# Patient Record
Sex: Female | Born: 1940 | Hispanic: No | Smoking: Never smoker
Health system: Southern US, Community
[De-identification: ages and names within clinical notes are randomized; demographics above are authoritative.]

## PROBLEM LIST (undated history)

## (undated) DIAGNOSIS — E039 Hypothyroidism, unspecified: Secondary | ICD-10-CM

## (undated) DIAGNOSIS — E119 Type 2 diabetes mellitus without complications: Secondary | ICD-10-CM

## (undated) DIAGNOSIS — I2510 Atherosclerotic heart disease of native coronary artery without angina pectoris: Secondary | ICD-10-CM

## (undated) DIAGNOSIS — F32A Depression, unspecified: Secondary | ICD-10-CM

## (undated) DIAGNOSIS — I10 Essential (primary) hypertension: Secondary | ICD-10-CM

## (undated) DIAGNOSIS — E785 Hyperlipidemia, unspecified: Secondary | ICD-10-CM

## (undated) DIAGNOSIS — J45909 Unspecified asthma, uncomplicated: Secondary | ICD-10-CM

## (undated) DIAGNOSIS — F329 Major depressive disorder, single episode, unspecified: Secondary | ICD-10-CM

## (undated) HISTORY — PX: OTHER SURGICAL HISTORY: SHX169

## (undated) HISTORY — PX: MINOR HEMORRHOIDECTOMY: SHX6238

## (undated) HISTORY — DX: Essential (primary) hypertension: I10

## (undated) HISTORY — DX: Major depressive disorder, single episode, unspecified: F32.9

## (undated) HISTORY — DX: Depression, unspecified: F32.A

## (undated) HISTORY — DX: Type 2 diabetes mellitus without complications: E11.9

## (undated) HISTORY — PX: EYE SURGERY: SHX253

## (undated) HISTORY — DX: Hyperlipidemia, unspecified: E78.5

---

## 1998-03-07 HISTORY — PX: COLONOSCOPY: SHX174

## 2003-10-26 ENCOUNTER — Inpatient Hospital Stay (HOSPITAL_COMMUNITY): Admission: EM | Admit: 2003-10-26 | Discharge: 2003-10-30 | Payer: Self-pay | Admitting: Cardiology

## 2004-01-26 ENCOUNTER — Inpatient Hospital Stay (HOSPITAL_COMMUNITY): Admission: AD | Admit: 2004-01-26 | Discharge: 2004-01-28 | Payer: Self-pay | Admitting: Cardiology

## 2004-08-08 ENCOUNTER — Inpatient Hospital Stay (HOSPITAL_COMMUNITY): Admission: EM | Admit: 2004-08-08 | Discharge: 2004-08-12 | Payer: Self-pay | Admitting: Emergency Medicine

## 2004-10-05 ENCOUNTER — Ambulatory Visit (HOSPITAL_COMMUNITY): Admission: RE | Admit: 2004-10-05 | Discharge: 2004-10-05 | Payer: Self-pay | Admitting: *Deleted

## 2005-01-07 ENCOUNTER — Ambulatory Visit (HOSPITAL_COMMUNITY): Admission: RE | Admit: 2005-01-07 | Discharge: 2005-01-07 | Payer: Self-pay | Admitting: Cardiology

## 2005-01-20 ENCOUNTER — Inpatient Hospital Stay (HOSPITAL_COMMUNITY): Admission: RE | Admit: 2005-01-20 | Discharge: 2005-01-21 | Payer: Self-pay | Admitting: Cardiology

## 2005-12-21 ENCOUNTER — Ambulatory Visit: Payer: Self-pay | Admitting: Internal Medicine

## 2006-03-30 ENCOUNTER — Inpatient Hospital Stay (HOSPITAL_COMMUNITY): Admission: EM | Admit: 2006-03-30 | Discharge: 2006-04-01 | Payer: Self-pay | Admitting: Emergency Medicine

## 2006-05-05 ENCOUNTER — Ambulatory Visit: Payer: Self-pay | Admitting: Internal Medicine

## 2007-11-21 ENCOUNTER — Ambulatory Visit: Payer: Self-pay

## 2010-03-28 ENCOUNTER — Encounter: Payer: Self-pay | Admitting: Cardiology

## 2010-12-24 ENCOUNTER — Other Ambulatory Visit (HOSPITAL_COMMUNITY): Payer: Self-pay | Admitting: Cardiology

## 2010-12-29 ENCOUNTER — Other Ambulatory Visit (HOSPITAL_COMMUNITY): Payer: Self-pay

## 2011-02-22 ENCOUNTER — Other Ambulatory Visit (HOSPITAL_COMMUNITY): Payer: Self-pay | Admitting: Cardiology

## 2011-02-28 ENCOUNTER — Other Ambulatory Visit (HOSPITAL_COMMUNITY): Payer: Self-pay

## 2011-03-03 ENCOUNTER — Ambulatory Visit (HOSPITAL_COMMUNITY): Payer: Self-pay

## 2011-03-03 ENCOUNTER — Ambulatory Visit (HOSPITAL_COMMUNITY)
Admission: RE | Admit: 2011-03-03 | Discharge: 2011-03-03 | Disposition: A | Discharge status: Home / Self Care | Payer: Medicare Other | Source: Ambulatory Visit | Attending: Cardiology | Admitting: Cardiology

## 2011-03-03 DIAGNOSIS — R0602 Shortness of breath: Secondary | ICD-10-CM | POA: Insufficient documentation

## 2011-03-03 DIAGNOSIS — E119 Type 2 diabetes mellitus without complications: Secondary | ICD-10-CM | POA: Insufficient documentation

## 2011-03-03 DIAGNOSIS — R079 Chest pain, unspecified: Secondary | ICD-10-CM | POA: Insufficient documentation

## 2011-03-03 DIAGNOSIS — I10 Essential (primary) hypertension: Secondary | ICD-10-CM | POA: Insufficient documentation

## 2011-03-03 DIAGNOSIS — I2510 Atherosclerotic heart disease of native coronary artery without angina pectoris: Secondary | ICD-10-CM | POA: Insufficient documentation

## 2011-03-03 MED ORDER — TECHNETIUM TC 99M TETROFOSMIN IV KIT
30.0000 | PACK | Freq: Once | INTRAVENOUS | Status: AC | PRN
  Administered 2011-03-03: 30 via INTRAVENOUS

## 2011-03-03 MED ORDER — REGADENOSON 0.4 MG/5ML IV SOLN
INTRAVENOUS | Status: AC
  Administered 2011-03-03: 0.4 mg via INTRAVENOUS
  Filled 2011-03-03: qty 5

## 2011-03-03 MED ORDER — TECHNETIUM TC 99M TETROFOSMIN IV KIT
10.0000 | PACK | Freq: Once | INTRAVENOUS | Status: AC | PRN
  Administered 2011-03-03: 10 via INTRAVENOUS

## 2011-03-10 DIAGNOSIS — E1065 Type 1 diabetes mellitus with hyperglycemia: Secondary | ICD-10-CM | POA: Diagnosis not present

## 2011-03-10 DIAGNOSIS — R0602 Shortness of breath: Secondary | ICD-10-CM | POA: Diagnosis not present

## 2011-03-10 DIAGNOSIS — D518 Other vitamin B12 deficiency anemias: Secondary | ICD-10-CM | POA: Diagnosis not present

## 2011-03-10 DIAGNOSIS — M159 Polyosteoarthritis, unspecified: Secondary | ICD-10-CM | POA: Diagnosis not present

## 2011-03-10 DIAGNOSIS — E039 Hypothyroidism, unspecified: Secondary | ICD-10-CM | POA: Diagnosis not present

## 2011-03-10 DIAGNOSIS — N182 Chronic kidney disease, stage 2 (mild): Secondary | ICD-10-CM | POA: Diagnosis not present

## 2011-03-10 DIAGNOSIS — E785 Hyperlipidemia, unspecified: Secondary | ICD-10-CM | POA: Diagnosis not present

## 2011-03-10 DIAGNOSIS — F0280 Dementia in other diseases classified elsewhere without behavioral disturbance: Secondary | ICD-10-CM | POA: Diagnosis not present

## 2011-09-20 DIAGNOSIS — I6529 Occlusion and stenosis of unspecified carotid artery: Secondary | ICD-10-CM | POA: Diagnosis not present

## 2011-09-20 DIAGNOSIS — J45909 Unspecified asthma, uncomplicated: Secondary | ICD-10-CM | POA: Diagnosis not present

## 2011-09-20 DIAGNOSIS — J309 Allergic rhinitis, unspecified: Secondary | ICD-10-CM | POA: Diagnosis not present

## 2011-09-20 DIAGNOSIS — E1065 Type 1 diabetes mellitus with hyperglycemia: Secondary | ICD-10-CM | POA: Diagnosis not present

## 2011-09-20 DIAGNOSIS — M1990 Unspecified osteoarthritis, unspecified site: Secondary | ICD-10-CM | POA: Diagnosis not present

## 2011-09-20 DIAGNOSIS — E782 Mixed hyperlipidemia: Secondary | ICD-10-CM | POA: Diagnosis not present

## 2011-09-20 DIAGNOSIS — M159 Polyosteoarthritis, unspecified: Secondary | ICD-10-CM | POA: Diagnosis not present

## 2011-09-20 DIAGNOSIS — N182 Chronic kidney disease, stage 2 (mild): Secondary | ICD-10-CM | POA: Diagnosis not present

## 2012-08-16 DIAGNOSIS — E039 Hypothyroidism, unspecified: Secondary | ICD-10-CM | POA: Diagnosis not present

## 2012-08-16 DIAGNOSIS — I2510 Atherosclerotic heart disease of native coronary artery without angina pectoris: Secondary | ICD-10-CM | POA: Diagnosis not present

## 2012-08-16 DIAGNOSIS — I10 Essential (primary) hypertension: Secondary | ICD-10-CM | POA: Diagnosis not present

## 2012-08-16 DIAGNOSIS — E782 Mixed hyperlipidemia: Secondary | ICD-10-CM | POA: Diagnosis not present

## 2012-08-16 DIAGNOSIS — D509 Iron deficiency anemia, unspecified: Secondary | ICD-10-CM | POA: Diagnosis not present

## 2012-08-16 DIAGNOSIS — R0602 Shortness of breath: Secondary | ICD-10-CM | POA: Diagnosis not present

## 2012-08-16 DIAGNOSIS — D518 Other vitamin B12 deficiency anemias: Secondary | ICD-10-CM | POA: Diagnosis not present

## 2012-08-16 DIAGNOSIS — G4701 Insomnia due to medical condition: Secondary | ICD-10-CM | POA: Diagnosis not present

## 2012-08-16 DIAGNOSIS — J8410 Pulmonary fibrosis, unspecified: Secondary | ICD-10-CM | POA: Diagnosis not present

## 2012-08-16 DIAGNOSIS — N182 Chronic kidney disease, stage 2 (mild): Secondary | ICD-10-CM | POA: Diagnosis not present

## 2012-08-16 DIAGNOSIS — J029 Acute pharyngitis, unspecified: Secondary | ICD-10-CM | POA: Diagnosis not present

## 2012-08-20 DIAGNOSIS — N182 Chronic kidney disease, stage 2 (mild): Secondary | ICD-10-CM | POA: Diagnosis not present

## 2012-08-20 DIAGNOSIS — R109 Unspecified abdominal pain: Secondary | ICD-10-CM | POA: Diagnosis not present

## 2012-08-29 ENCOUNTER — Ambulatory Visit: Payer: Self-pay | Admitting: Internal Medicine

## 2012-08-29 DIAGNOSIS — R059 Cough, unspecified: Secondary | ICD-10-CM | POA: Diagnosis not present

## 2012-08-29 DIAGNOSIS — R0602 Shortness of breath: Secondary | ICD-10-CM | POA: Diagnosis not present

## 2012-08-29 DIAGNOSIS — J984 Other disorders of lung: Secondary | ICD-10-CM | POA: Diagnosis not present

## 2012-08-29 DIAGNOSIS — R918 Other nonspecific abnormal finding of lung field: Secondary | ICD-10-CM | POA: Diagnosis not present

## 2012-08-31 DIAGNOSIS — Z111 Encounter for screening for respiratory tuberculosis: Secondary | ICD-10-CM | POA: Diagnosis not present

## 2012-08-31 DIAGNOSIS — I63239 Cerebral infarction due to unspecified occlusion or stenosis of unspecified carotid arteries: Secondary | ICD-10-CM | POA: Diagnosis not present

## 2012-09-24 ENCOUNTER — Ambulatory Visit: Payer: Self-pay | Admitting: Physician Assistant

## 2012-09-24 DIAGNOSIS — J168 Pneumonia due to other specified infectious organisms: Secondary | ICD-10-CM | POA: Diagnosis not present

## 2012-09-24 DIAGNOSIS — R05 Cough: Secondary | ICD-10-CM | POA: Diagnosis not present

## 2012-09-24 DIAGNOSIS — I2510 Atherosclerotic heart disease of native coronary artery without angina pectoris: Secondary | ICD-10-CM | POA: Diagnosis not present

## 2012-09-24 DIAGNOSIS — J189 Pneumonia, unspecified organism: Secondary | ICD-10-CM | POA: Diagnosis not present

## 2012-09-24 DIAGNOSIS — R0602 Shortness of breath: Secondary | ICD-10-CM | POA: Diagnosis not present

## 2012-09-27 DIAGNOSIS — M79609 Pain in unspecified limb: Secondary | ICD-10-CM | POA: Diagnosis not present

## 2012-09-28 DIAGNOSIS — S92919A Unspecified fracture of unspecified toe(s), initial encounter for closed fracture: Secondary | ICD-10-CM | POA: Diagnosis not present

## 2012-09-28 DIAGNOSIS — M25579 Pain in unspecified ankle and joints of unspecified foot: Secondary | ICD-10-CM | POA: Diagnosis not present

## 2012-10-17 ENCOUNTER — Ambulatory Visit: Payer: Self-pay | Admitting: Internal Medicine

## 2012-10-17 DIAGNOSIS — J150 Pneumonia due to Klebsiella pneumoniae: Secondary | ICD-10-CM | POA: Diagnosis not present

## 2012-10-17 DIAGNOSIS — J129 Viral pneumonia, unspecified: Secondary | ICD-10-CM | POA: Diagnosis not present

## 2012-10-18 DIAGNOSIS — IMO0002 Reserved for concepts with insufficient information to code with codable children: Secondary | ICD-10-CM | POA: Diagnosis not present

## 2012-10-18 DIAGNOSIS — E1142 Type 2 diabetes mellitus with diabetic polyneuropathy: Secondary | ICD-10-CM | POA: Diagnosis not present

## 2012-10-20 LAB — EXPECTORATED SPUTUM ASSESSMENT W GRAM STAIN, RFLX TO RESP C

## 2012-10-22 ENCOUNTER — Encounter: Payer: Self-pay | Admitting: Neurology

## 2012-10-22 DIAGNOSIS — M545 Low back pain: Secondary | ICD-10-CM | POA: Diagnosis not present

## 2012-10-22 DIAGNOSIS — R293 Abnormal posture: Secondary | ICD-10-CM | POA: Diagnosis not present

## 2012-10-22 DIAGNOSIS — IMO0001 Reserved for inherently not codable concepts without codable children: Secondary | ICD-10-CM | POA: Diagnosis not present

## 2012-10-24 DIAGNOSIS — M545 Low back pain: Secondary | ICD-10-CM | POA: Diagnosis not present

## 2012-10-24 DIAGNOSIS — R293 Abnormal posture: Secondary | ICD-10-CM | POA: Diagnosis not present

## 2012-10-24 DIAGNOSIS — IMO0001 Reserved for inherently not codable concepts without codable children: Secondary | ICD-10-CM | POA: Diagnosis not present

## 2012-10-26 ENCOUNTER — Ambulatory Visit: Payer: Self-pay | Admitting: Neurology

## 2012-10-26 DIAGNOSIS — M51379 Other intervertebral disc degeneration, lumbosacral region without mention of lumbar back pain or lower extremity pain: Secondary | ICD-10-CM | POA: Diagnosis not present

## 2012-10-26 DIAGNOSIS — M549 Dorsalgia, unspecified: Secondary | ICD-10-CM | POA: Diagnosis not present

## 2012-10-26 DIAGNOSIS — M5137 Other intervertebral disc degeneration, lumbosacral region: Secondary | ICD-10-CM | POA: Diagnosis not present

## 2012-11-01 DIAGNOSIS — IMO0001 Reserved for inherently not codable concepts without codable children: Secondary | ICD-10-CM | POA: Diagnosis not present

## 2012-11-01 DIAGNOSIS — R293 Abnormal posture: Secondary | ICD-10-CM | POA: Diagnosis not present

## 2012-11-01 DIAGNOSIS — M545 Low back pain: Secondary | ICD-10-CM | POA: Diagnosis not present

## 2012-11-05 ENCOUNTER — Encounter: Payer: Self-pay | Admitting: Neurology

## 2012-11-07 LAB — CULTURE, FUNGUS WITHOUT SMEAR

## 2012-11-14 ENCOUNTER — Other Ambulatory Visit (HOSPITAL_COMMUNITY): Payer: Self-pay | Admitting: Cardiology

## 2012-11-14 DIAGNOSIS — E119 Type 2 diabetes mellitus without complications: Secondary | ICD-10-CM | POA: Diagnosis not present

## 2012-11-14 DIAGNOSIS — R079 Chest pain, unspecified: Secondary | ICD-10-CM

## 2012-11-14 DIAGNOSIS — I2510 Atherosclerotic heart disease of native coronary artery without angina pectoris: Secondary | ICD-10-CM | POA: Diagnosis not present

## 2012-11-14 DIAGNOSIS — N189 Chronic kidney disease, unspecified: Secondary | ICD-10-CM | POA: Diagnosis not present

## 2012-11-14 DIAGNOSIS — R0609 Other forms of dyspnea: Secondary | ICD-10-CM | POA: Diagnosis not present

## 2012-11-14 DIAGNOSIS — E7800 Pure hypercholesterolemia, unspecified: Secondary | ICD-10-CM | POA: Diagnosis not present

## 2012-11-14 DIAGNOSIS — I10 Essential (primary) hypertension: Secondary | ICD-10-CM | POA: Diagnosis not present

## 2012-11-15 DIAGNOSIS — R0602 Shortness of breath: Secondary | ICD-10-CM | POA: Diagnosis not present

## 2012-11-15 DIAGNOSIS — I2510 Atherosclerotic heart disease of native coronary artery without angina pectoris: Secondary | ICD-10-CM | POA: Diagnosis not present

## 2012-11-19 ENCOUNTER — Encounter (HOSPITAL_COMMUNITY): Payer: Medicare Other

## 2012-11-21 ENCOUNTER — Ambulatory Visit: Payer: Self-pay | Admitting: General Surgery

## 2012-11-21 DIAGNOSIS — Z23 Encounter for immunization: Secondary | ICD-10-CM | POA: Diagnosis not present

## 2012-11-21 DIAGNOSIS — D689 Coagulation defect, unspecified: Secondary | ICD-10-CM | POA: Diagnosis not present

## 2012-11-21 DIAGNOSIS — D62 Acute posthemorrhagic anemia: Secondary | ICD-10-CM | POA: Diagnosis not present

## 2012-11-21 DIAGNOSIS — Z86718 Personal history of other venous thrombosis and embolism: Secondary | ICD-10-CM | POA: Diagnosis not present

## 2012-11-21 DIAGNOSIS — D518 Other vitamin B12 deficiency anemias: Secondary | ICD-10-CM | POA: Diagnosis not present

## 2012-11-21 DIAGNOSIS — I10 Essential (primary) hypertension: Secondary | ICD-10-CM | POA: Diagnosis not present

## 2012-11-21 DIAGNOSIS — Z79899 Other long term (current) drug therapy: Secondary | ICD-10-CM | POA: Diagnosis not present

## 2012-11-27 ENCOUNTER — Encounter (HOSPITAL_COMMUNITY)
Admission: RE | Disposition: A | Discharge status: Home / Self Care | Payer: Self-pay | Source: Ambulatory Visit | Attending: Cardiology

## 2012-11-27 ENCOUNTER — Ambulatory Visit (HOSPITAL_COMMUNITY)
Admission: RE | Admit: 2012-11-27 | Discharge: 2012-11-27 | Disposition: A | Discharge status: Home / Self Care | Payer: Medicare Other | Source: Ambulatory Visit | Attending: Cardiology | Admitting: Cardiology

## 2012-11-27 ENCOUNTER — Encounter (HOSPITAL_COMMUNITY): Payer: Self-pay | Admitting: Pharmacy Technician

## 2012-11-27 DIAGNOSIS — I129 Hypertensive chronic kidney disease with stage 1 through stage 4 chronic kidney disease, or unspecified chronic kidney disease: Secondary | ICD-10-CM | POA: Insufficient documentation

## 2012-11-27 DIAGNOSIS — D631 Anemia in chronic kidney disease: Secondary | ICD-10-CM | POA: Diagnosis not present

## 2012-11-27 DIAGNOSIS — R079 Chest pain, unspecified: Secondary | ICD-10-CM | POA: Diagnosis not present

## 2012-11-27 DIAGNOSIS — I2510 Atherosclerotic heart disease of native coronary artery without angina pectoris: Secondary | ICD-10-CM | POA: Diagnosis not present

## 2012-11-27 DIAGNOSIS — E039 Hypothyroidism, unspecified: Secondary | ICD-10-CM | POA: Diagnosis not present

## 2012-11-27 DIAGNOSIS — I10 Essential (primary) hypertension: Secondary | ICD-10-CM | POA: Diagnosis not present

## 2012-11-27 DIAGNOSIS — E7800 Pure hypercholesterolemia, unspecified: Secondary | ICD-10-CM | POA: Diagnosis not present

## 2012-11-27 DIAGNOSIS — E119 Type 2 diabetes mellitus without complications: Secondary | ICD-10-CM | POA: Diagnosis not present

## 2012-11-27 DIAGNOSIS — N182 Chronic kidney disease, stage 2 (mild): Secondary | ICD-10-CM | POA: Insufficient documentation

## 2012-11-27 DIAGNOSIS — R9430 Abnormal result of cardiovascular function study, unspecified: Secondary | ICD-10-CM | POA: Diagnosis not present

## 2012-11-27 DIAGNOSIS — I200 Unstable angina: Secondary | ICD-10-CM | POA: Diagnosis not present

## 2012-11-27 HISTORY — PX: LEFT HEART CATHETERIZATION WITH CORONARY ANGIOGRAM: SHX5451

## 2012-11-27 LAB — GLUCOSE, CAPILLARY
Glucose-Capillary: 131 mg/dL — ABNORMAL HIGH (ref 70–99)
Glucose-Capillary: 142 mg/dL — ABNORMAL HIGH (ref 70–99)

## 2012-11-27 SURGERY — LEFT HEART CATHETERIZATION WITH CORONARY ANGIOGRAM
Anesthesia: LOCAL

## 2012-11-27 MED ORDER — NITROGLYCERIN 0.2 MG/ML ON CALL CATH LAB
INTRAVENOUS | Status: AC
  Filled 2012-11-27: qty 1

## 2012-11-27 MED ORDER — SODIUM CHLORIDE 0.9 % IV SOLN: 250.0000 mL | INTRAVENOUS | Status: DC | PRN

## 2012-11-27 MED ORDER — ASPIRIN 81 MG PO CHEW
324.0000 mg | CHEWABLE_TABLET | ORAL | Status: AC
  Administered 2012-11-27: 324 mg via ORAL
  Filled 2012-11-27: qty 4

## 2012-11-27 MED ORDER — SODIUM CHLORIDE 0.9 % IV SOLN: INTRAVENOUS | Status: DC

## 2012-11-27 MED ORDER — FENTANYL CITRATE 0.05 MG/ML IJ SOLN
INTRAMUSCULAR | Status: AC
  Filled 2012-11-27: qty 2

## 2012-11-27 MED ORDER — SODIUM CHLORIDE 0.9 % IJ SOLN: 3.0000 mL | INTRAMUSCULAR | Status: DC | PRN

## 2012-11-27 MED ORDER — ACETAMINOPHEN 325 MG PO TABS: 650.0000 mg | ORAL_TABLET | ORAL | Status: DC | PRN

## 2012-11-27 MED ORDER — MIDAZOLAM HCL 2 MG/2ML IJ SOLN
INTRAMUSCULAR | Status: AC
  Filled 2012-11-27: qty 2

## 2012-11-27 MED ORDER — HEPARIN (PORCINE) IN NACL 2-0.9 UNIT/ML-% IJ SOLN
INTRAMUSCULAR | Status: AC
  Filled 2012-11-27: qty 1000

## 2012-11-27 MED ORDER — LIDOCAINE HCL (PF) 1 % IJ SOLN
INTRAMUSCULAR | Status: AC
  Filled 2012-11-27: qty 30

## 2012-11-27 MED ORDER — SODIUM CHLORIDE 0.9 % IJ SOLN: 3.0000 mL | Freq: Two times a day (BID) | INTRAMUSCULAR | Status: DC

## 2012-11-27 MED ORDER — ONDANSETRON HCL 4 MG/2ML IJ SOLN: 4.0000 mg | Freq: Four times a day (QID) | INTRAMUSCULAR | Status: DC | PRN

## 2012-11-27 MED ORDER — SODIUM CHLORIDE 0.9 % IV SOLN
INTRAVENOUS | Status: DC
  Administered 2012-11-27: 08:00:00 via INTRAVENOUS

## 2012-11-27 MED ORDER — DIAZEPAM 5 MG PO TABS
5.0000 mg | ORAL_TABLET | ORAL | Status: AC
  Administered 2012-11-27: 5 mg via ORAL
  Filled 2012-11-27: qty 1

## 2012-11-27 NOTE — Cardiovascular Report (Signed)
NAMEMarland Kitchen  ERANDI, LEMMA                 ACCOUNT NO.:  [id number]  MEDICAL RECORD NO.:  [id number]  LOCATION:  [facility]                         FACILITY:  [facility]  PHYSICIAN:  Eduardo Osier. Sharyn Lull, M.D. DATE OF BIRTH:  Apr 18, 1940  DATE OF PROCEDURE:  11/27/2012 DATE OF DISCHARGE:                           CARDIAC CATHETERIZATION   PROCEDURE:  Left cardiac cath with selective left and right coronary angiography, left ventriculography via right groin using Judkins technique.  INDICATION FOR THE PROCEDURE:  Ms. 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  Chest pain relieved with rest.  Also complains of exertional dyspnea with minimal exertion.  The patient states she was able to walk more than half a mile without any problem before and now gets short of breath with walking less than half a block.  The patient denies any rest or nocturnal angina.  The patient underwent Lexiscan Myoview on November 20, 2012 which showed anteroseptal and apical reversible ischemia and prior inferior infarct with EF of 63%.  Due to typical anginal chest pain, multiple risk factors, and abnormal Lexiscan Myoview, discussed with the patient, her son and daughter regarding left cath, possible PTCA stenting, its risks and benefits, i.e., death, MI, stroke, need for emergency CABG, local vascular complications, workup for anemia which has been negative in the past, etc., and wanted to proceed and consented for the procedure.  PROCEDURE  DESCRIPTION:  After obtaining the informed consent, the patient was brought to the cath lab and was placed on fluoroscopy table. Right groin was prepped and draped in usual fashion.  Xylocaine 1% was used for local anesthesia in the right groin.  With the help of thin wall needle, 5-French arterial sheath was placed.  The sheath was aspirated and flushed.  Next, 5-French left Judkins catheter was advanced over the wire under fluoroscopic guidance up to the ascending aorta.  Wire was pulled out.  The catheter was aspirated and connected to the Manifold.  Catheter was further advanced and engaged into left coronary ostium.  Multiple views of the left system were taken.  Next, the catheter was disengaged and was pulled out over the wire and was replaced with 5-French right Judkins catheter, which was advanced over the wire under fluoroscopic guidance up to the ascending aorta.  Wire was pulled out.  The catheter was aspirated and connected to the Manifold.  Catheter was further advanced and engaged into right coronary ostium.  Multiple views of the right system were taken.  Next, catheter was disengaged and was pulled out over the wire and was replaced with 5- French pigtail catheter which was advanced over the wire under fluoroscopic guidance up to the ascending aorta.  Wire was pulled out. The catheter was aspirated and connected to the Manifold.  Catheter was further advanced across the  aortic valve into the LV.  LV pressures were recorded.  Next, LV graft was done in 30-degree RAO position.  Post- angiographic pressures were recorded from LV and then pullback pressures were recorded from the aorta.  There was no gradient across the aortic valve.  Next, the pigtail catheter was pulled out over the wire. Sheaths were aspirated and flushed.  FINDINGS:  LV showed mild LVH, good LV systolic function, EF of 55-60%. Left main was short which was patent.  LAD was patent at prior PTCA  and stented site in proximal portion.  Diagonal 1 and 2 were small which were patent.  Left circumflex was patent at prior PTCA stented site in mid portion.  Distally has 40-50% bifurcation stenosis with OM3.  OM1 has 50-60% ostial stenosis.  The vessel is moderate size with TIMI grade 3 distal flow. OM2 is small, diffusely diseased as before.  OM3 is very small which has 30-40% ostial stenosis.  RCA has 20-25% proximal stenosis.  Mid stented segment is widely patent.  PDA and PLV branches were small which were patent.  The patient tolerated procedure well.  There were no complications.  Plan is to treat medically.     Eduardo Osier. Sharyn Lull, M.D.     [facility]  D:  11/27/2012  T:  11/27/2012  Job:  [id number]

## 2012-11-27 NOTE — H&P (Signed)
  Handwritten H&P in the chart needs to be scanned. 

## 2012-11-27 NOTE — CV Procedure (Signed)
A cath report dictated on 11/27/2012 dictation [phone]

## 2012-12-05 ENCOUNTER — Ambulatory Visit: Payer: Self-pay | Admitting: General Surgery

## 2013-07-05 DIAGNOSIS — M25519 Pain in unspecified shoulder: Secondary | ICD-10-CM | POA: Diagnosis not present

## 2013-07-05 DIAGNOSIS — M542 Cervicalgia: Secondary | ICD-10-CM | POA: Diagnosis not present

## 2013-07-05 DIAGNOSIS — M5030 Other cervical disc degeneration, unspecified cervical region: Secondary | ICD-10-CM | POA: Diagnosis not present

## 2013-07-18 DIAGNOSIS — Z79899 Other long term (current) drug therapy: Secondary | ICD-10-CM | POA: Diagnosis not present

## 2013-07-18 DIAGNOSIS — D518 Other vitamin B12 deficiency anemias: Secondary | ICD-10-CM | POA: Diagnosis not present

## 2013-07-18 DIAGNOSIS — I2510 Atherosclerotic heart disease of native coronary artery without angina pectoris: Secondary | ICD-10-CM | POA: Diagnosis not present

## 2013-07-18 DIAGNOSIS — I63239 Cerebral infarction due to unspecified occlusion or stenosis of unspecified carotid arteries: Secondary | ICD-10-CM | POA: Diagnosis not present

## 2013-07-18 DIAGNOSIS — IMO0001 Reserved for inherently not codable concepts without codable children: Secondary | ICD-10-CM | POA: Diagnosis not present

## 2013-07-18 DIAGNOSIS — E782 Mixed hyperlipidemia: Secondary | ICD-10-CM | POA: Diagnosis not present

## 2013-07-18 DIAGNOSIS — N182 Chronic kidney disease, stage 2 (mild): Secondary | ICD-10-CM | POA: Diagnosis not present

## 2013-07-18 DIAGNOSIS — G4701 Insomnia due to medical condition: Secondary | ICD-10-CM | POA: Diagnosis not present

## 2013-07-18 DIAGNOSIS — E039 Hypothyroidism, unspecified: Secondary | ICD-10-CM | POA: Diagnosis not present

## 2013-07-18 DIAGNOSIS — E119 Type 2 diabetes mellitus without complications: Secondary | ICD-10-CM | POA: Diagnosis not present

## 2013-07-18 DIAGNOSIS — I10 Essential (primary) hypertension: Secondary | ICD-10-CM | POA: Diagnosis not present

## 2013-10-30 DIAGNOSIS — R0602 Shortness of breath: Secondary | ICD-10-CM | POA: Diagnosis not present

## 2013-11-08 DIAGNOSIS — R109 Unspecified abdominal pain: Secondary | ICD-10-CM | POA: Diagnosis not present

## 2013-11-08 DIAGNOSIS — R5383 Other fatigue: Secondary | ICD-10-CM | POA: Diagnosis not present

## 2013-11-08 DIAGNOSIS — N182 Chronic kidney disease, stage 2 (mild): Secondary | ICD-10-CM | POA: Diagnosis not present

## 2013-11-08 DIAGNOSIS — E119 Type 2 diabetes mellitus without complications: Secondary | ICD-10-CM | POA: Diagnosis not present

## 2013-11-08 DIAGNOSIS — G43919 Migraine, unspecified, intractable, without status migrainosus: Secondary | ICD-10-CM | POA: Diagnosis not present

## 2013-11-08 DIAGNOSIS — E039 Hypothyroidism, unspecified: Secondary | ICD-10-CM | POA: Diagnosis not present

## 2013-11-08 DIAGNOSIS — R5381 Other malaise: Secondary | ICD-10-CM | POA: Diagnosis not present

## 2013-11-08 DIAGNOSIS — J45909 Unspecified asthma, uncomplicated: Secondary | ICD-10-CM | POA: Diagnosis not present

## 2013-11-08 DIAGNOSIS — R110 Nausea: Secondary | ICD-10-CM | POA: Diagnosis not present

## 2013-11-13 ENCOUNTER — Ambulatory Visit: Payer: Self-pay | Admitting: Internal Medicine

## 2013-11-13 DIAGNOSIS — R51 Headache: Secondary | ICD-10-CM | POA: Diagnosis not present

## 2013-11-13 DIAGNOSIS — G319 Degenerative disease of nervous system, unspecified: Secondary | ICD-10-CM | POA: Diagnosis not present

## 2013-11-25 DIAGNOSIS — D518 Other vitamin B12 deficiency anemias: Secondary | ICD-10-CM | POA: Diagnosis not present

## 2013-11-25 DIAGNOSIS — R109 Unspecified abdominal pain: Secondary | ICD-10-CM | POA: Diagnosis not present

## 2013-12-05 DIAGNOSIS — N182 Chronic kidney disease, stage 2 (mild): Secondary | ICD-10-CM | POA: Diagnosis not present

## 2013-12-11 DIAGNOSIS — E875 Hyperkalemia: Secondary | ICD-10-CM | POA: Diagnosis not present

## 2013-12-18 DIAGNOSIS — H04223 Epiphora due to insufficient drainage, bilateral lacrimal glands: Secondary | ICD-10-CM | POA: Diagnosis not present

## 2013-12-23 DIAGNOSIS — N182 Chronic kidney disease, stage 2 (mild): Secondary | ICD-10-CM | POA: Diagnosis not present

## 2014-02-13 ENCOUNTER — Encounter (HOSPITAL_COMMUNITY): Payer: Self-pay | Admitting: Cardiology

## 2015-05-17 IMAGING — CT CT CHEST W/O CM
1 series · 14 of 33 positions shown, 18 images · non-contrast
Comparison: none

REASON FOR EXAM: HIGH RESOLUTION cough  SOB   NO CONTRAST PER DR
COMMENTS:

[Series 2: hi res 3.0 i41f 3 · axial · 0.71mm/px · z∈[-595,-361]mm · 14 of 92 slices shown, 18 images]
[im 7/92  mediastinal]
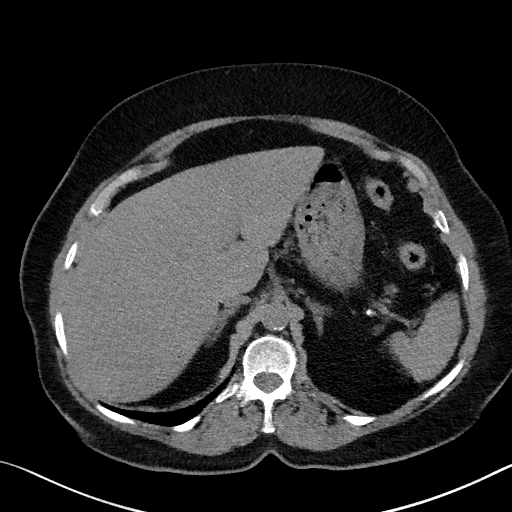
[im 7/92  lung]
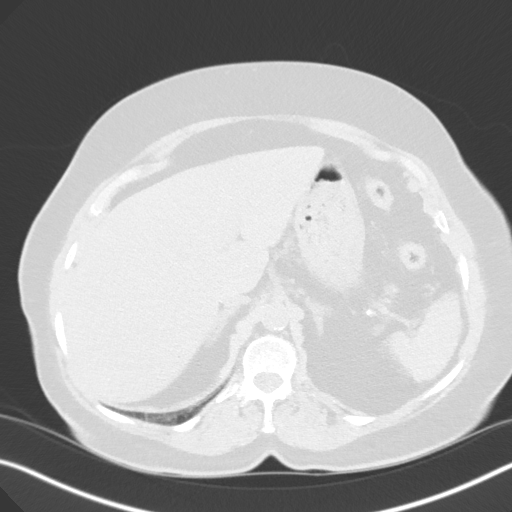
[im 14/92  lung]
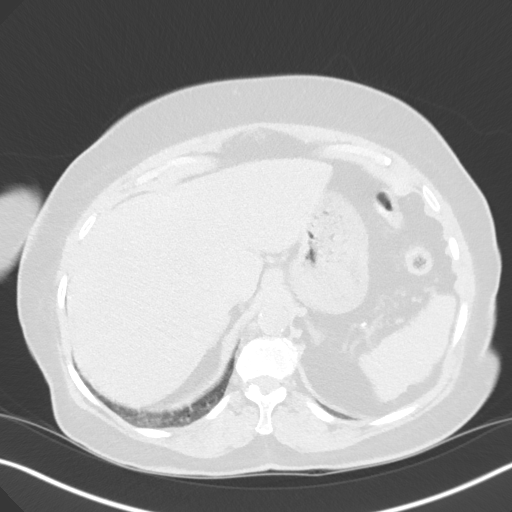
[im 19/92  lung]
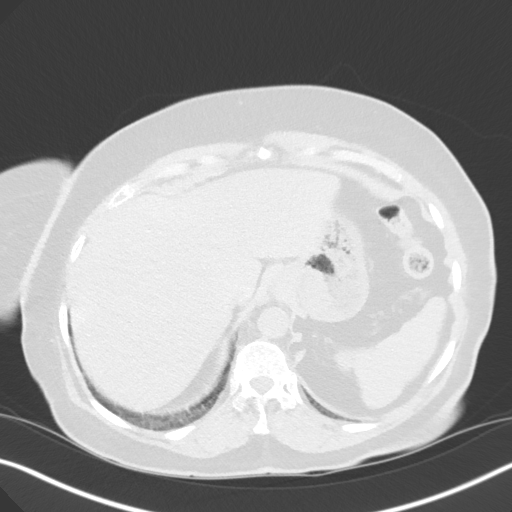
[im 24/92  lung]
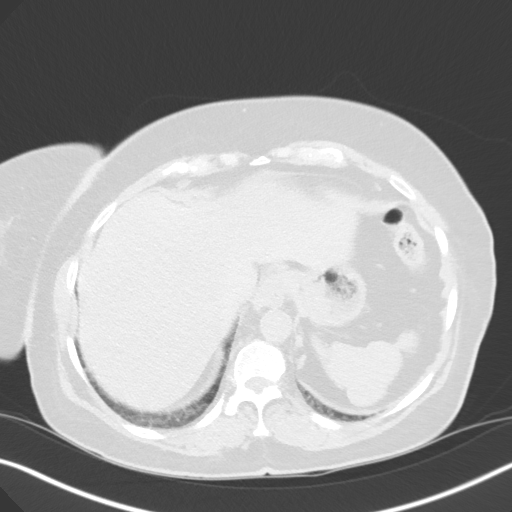
[im 31/92  mediastinal]
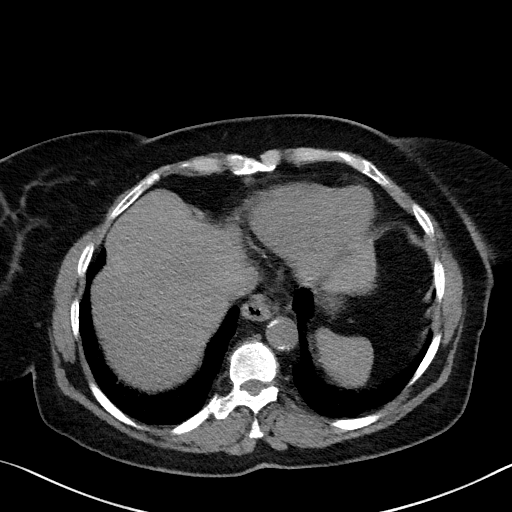
[im 31/92  lung]
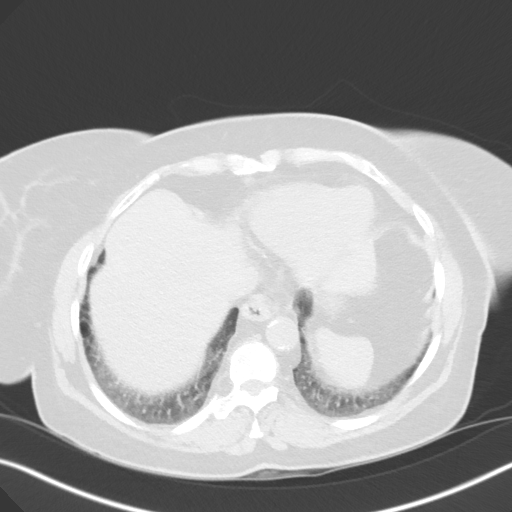
[im 38/92  lung]
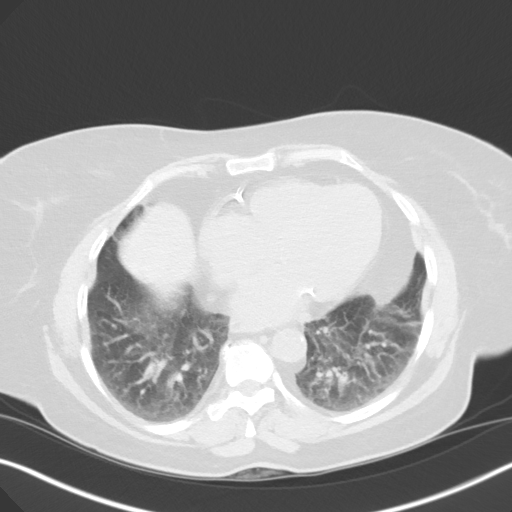
[im 44/92  lung]
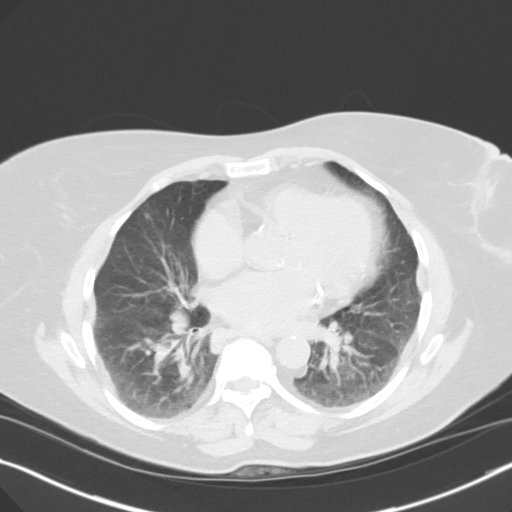
[im 49/92  lung]
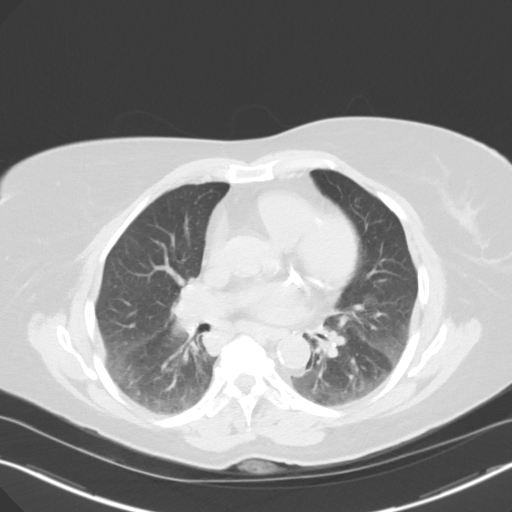
[im 54/92  mediastinal]
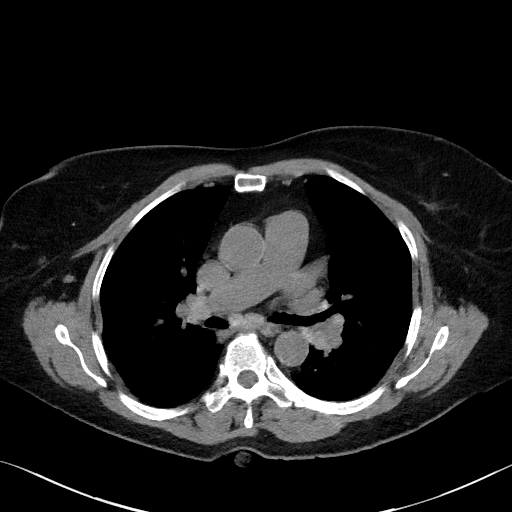
[im 54/92  lung]
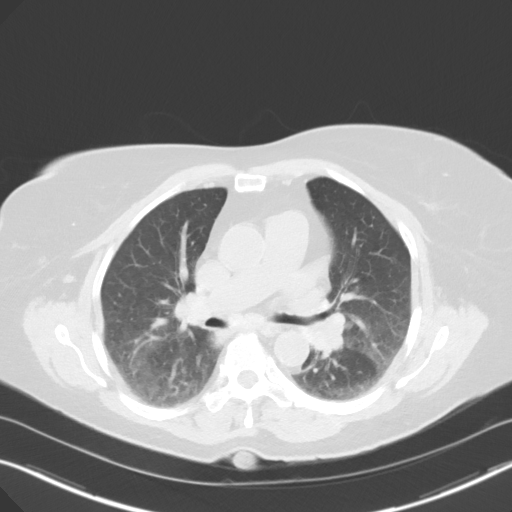
[im 61/92  lung]
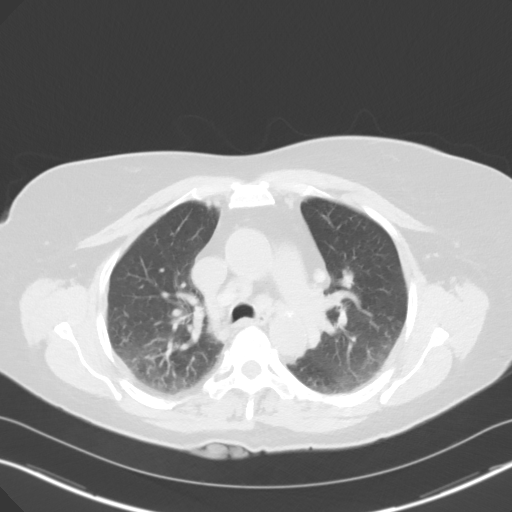
[im 68/92  lung]
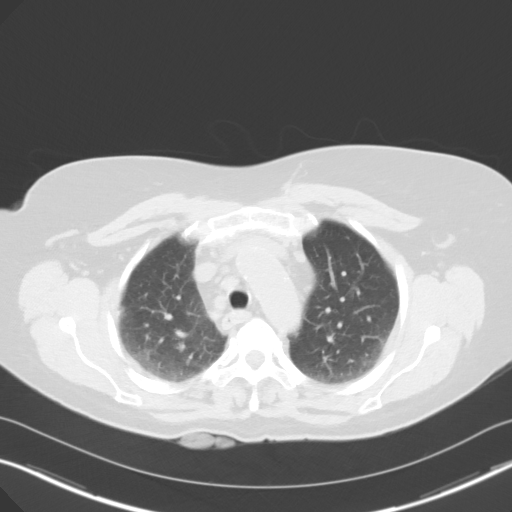
[im 73/92  lung]
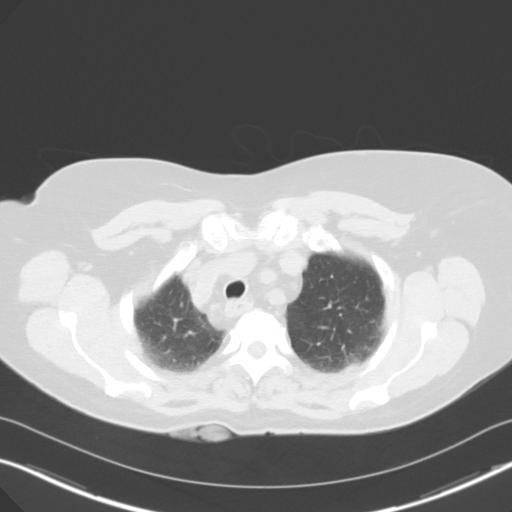
[im 78/92  mediastinal]
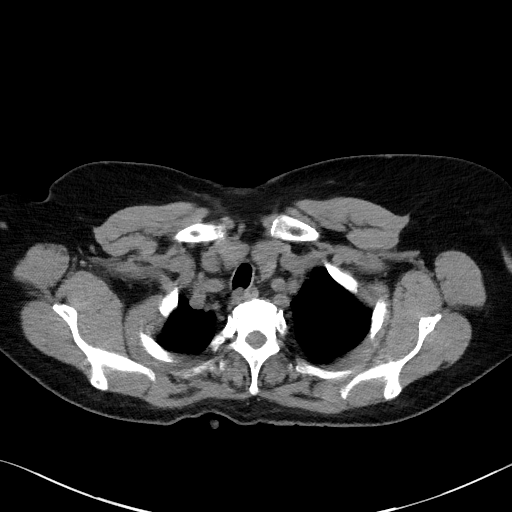
[im 78/92  lung]
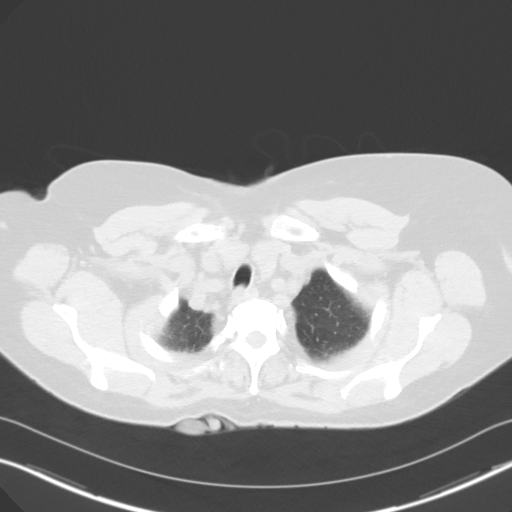
[im 85/92  lung]
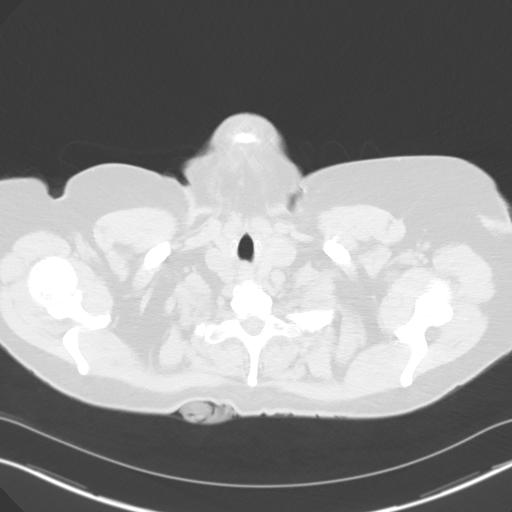

[14 of 33 positions shown; findings below may reference images not displayed]

PROCEDURE:     KCT - KCT CHEST WITHOUT CONTRAST  - August 29, 2012 [DATE]

RESULT:     Chest CT without contrast is performed utilizing a multislice
helical acquisition with 3 mm reconstructions submitted. There is no
previous similar exam for comparison.

Mediastinal window images demonstrate some calcification within the left
thyroid lobe. Correlate with physical and laboratory findings. Ultrasound is
available for further assessment. There is atherosclerotic calcification
within the thoracic aorta without evidence of aneurysm. Enlarged mediastinal
and hilar lymph nodes are seen with the largest paratracheal short axis
measurement being 10 mm. There is a precarinal lymph node with a short axis
of 9 mm and there are multiple hilar lymph nodes or not pathologically
enlarged by size criteria. The largest appears to be approximately the 8 mm
short axis. Nonenlarged axillary lymph nodes are seen. The heart is mildly
enlarged. There appears to be mitral annulus calcification. Coronary artery
atherosclerotic calcification appears present. Some of the images suggest
there may be coronary stents in place because of the artifact. Correlate
with history. Correlate for known cardiac vascular disease. There is no
pleural or pericardial effusion. The adrenal glands appear unremarkable.
There is some respiratory motion artifact present on some of the images.
High-resolution reconstruction 1 mm images show groundglass areas of fairly
diffuse increased density in the lower lung zones and in the posterior mid
and upper lung zones. The nondependent region is not showing this pattern.
This could be secondary to mild pneumonitis or atelectasis. The appearance
is more intense in the lower lobe and lung base regions. A discrete
pulmonary mass is not appreciated. No endobronchial lesion is evident. There
is no pneumothorax.
IMPRESSION: Ground glass attenuation fairly diffusely especially in the
lower lung zones and in the dependent upper lung zones and midlung zones.
Correlate for underlying edema versus pneumonitis. Nonenlarged prominent and
mildly enlarged mediastinal and hilar lymph nodes as described may be
reactive. Correlate for infectious or inflammatory process. No discrete mass
evident.

[REDACTED]

## 2015-10-23 DIAGNOSIS — Z0001 Encounter for general adult medical examination with abnormal findings: Secondary | ICD-10-CM | POA: Diagnosis not present

## 2015-10-23 DIAGNOSIS — M064 Inflammatory polyarthropathy: Secondary | ICD-10-CM | POA: Diagnosis not present

## 2015-10-23 DIAGNOSIS — D519 Vitamin B12 deficiency anemia, unspecified: Secondary | ICD-10-CM | POA: Diagnosis not present

## 2015-10-23 DIAGNOSIS — E1165 Type 2 diabetes mellitus with hyperglycemia: Secondary | ICD-10-CM | POA: Diagnosis not present

## 2015-10-23 DIAGNOSIS — R5381 Other malaise: Secondary | ICD-10-CM | POA: Diagnosis not present

## 2015-10-23 DIAGNOSIS — E782 Mixed hyperlipidemia: Secondary | ICD-10-CM | POA: Diagnosis not present

## 2015-10-23 DIAGNOSIS — E559 Vitamin D deficiency, unspecified: Secondary | ICD-10-CM | POA: Diagnosis not present

## 2015-10-23 DIAGNOSIS — R1011 Right upper quadrant pain: Secondary | ICD-10-CM | POA: Diagnosis not present

## 2015-10-23 DIAGNOSIS — N182 Chronic kidney disease, stage 2 (mild): Secondary | ICD-10-CM | POA: Diagnosis not present

## 2015-11-04 DIAGNOSIS — R109 Unspecified abdominal pain: Secondary | ICD-10-CM | POA: Diagnosis not present

## 2015-11-11 ENCOUNTER — Ambulatory Visit (INDEPENDENT_AMBULATORY_CARE_PROVIDER_SITE_OTHER): Payer: Medicare Other | Admitting: General Surgery

## 2015-11-11 ENCOUNTER — Encounter: Payer: Self-pay | Admitting: General Surgery

## 2015-11-11 VITALS — BP 126/60 | HR 62 | Resp 14 | Ht 63.0 in | Wt 160.0 lb

## 2015-11-11 DIAGNOSIS — K8020 Calculus of gallbladder without cholecystitis without obstruction: Secondary | ICD-10-CM | POA: Diagnosis not present

## 2015-11-11 DIAGNOSIS — I6523 Occlusion and stenosis of bilateral carotid arteries: Secondary | ICD-10-CM | POA: Diagnosis not present

## 2015-11-11 NOTE — Progress Notes (Signed)
Patient ID: Evelyn Stark, female   DOB: 22-Feb-1941, 75 y.o.   MRN: [id number]  Chief Complaint  Patient presents with  . Other    gall stones    HPI Evelyn Stark is a 75 y.o. female.  Here today for evaluation of gall stones. She has been having abdominal pain for about three years. The pain is located in her right upper quadrant and radiates to her back. Pain last for about a day or so. When she eats greens the pain comes more often. Ultrasound was performed last week.  I have reviewed the history of present illness with the patient.  HPI  Past Medical History:  Diagnosis Date  . Depression   . Diabetes mellitus without complication (HCC)   . Hyperlipidemia   . Hypertension     Past Surgical History:  Procedure Laterality Date  . COLONOSCOPY  2000  . EYE SURGERY    . LEFT HEART CATHETERIZATION WITH CORONARY ANGIOGRAM N/A 11/27/2012   Procedure: LEFT HEART CATHETERIZATION WITH CORONARY ANGIOGRAM;  Surgeon: Robynn Pane, MD;  Location[facility] CATH LAB;  Service: Cardiovascular;  Laterality: N/A;  . MINOR HEMORRHOIDECTOMY      No family history on file.  Social History Social History  Substance Use Topics  . Smoking status: Never Smoker  . Smokeless tobacco: Never Used  . Alcohol use No    No Known Allergies  Current Outpatient Prescriptions  Medication Sig Dispense Refill  . ALPRAZolam (XANAX) 0.25 MG tablet Take 0.25 mg by mouth 2 (two) times daily as needed for sleep or anxiety.    Marland Kitchen amLODipine (NORVASC) 10 MG tablet Take 10 mg by mouth 2 (two) times daily.    Marland Kitchen aspirin EC 81 MG tablet Take 81 mg by mouth daily.    Marland Kitchen atorvastatin (LIPITOR) 20 MG tablet Take 20 mg by mouth daily.    Marland Kitchen escitalopram (LEXAPRO) 20 MG tablet Take 10 mg by mouth daily.    Marland Kitchen glimepiride (AMARYL) 4 MG tablet Take 4 mg by mouth 2 (two) times daily.    . hydrALAZINE (APRESOLINE) 50 MG tablet Take 100 mg by mouth 3 (three) times daily.     . insulin glargine (LANTUS) 100 UNIT/ML injection Inject 20  Units into the skin daily.    . isosorbide mononitrate (IMDUR) 30 MG 24 hr tablet Take 30 mg by mouth daily.    Marland Kitchen levothyroxine (SYNTHROID, LEVOTHROID) 137 MCG tablet Take 137 mcg by mouth daily before breakfast.    . Nebivolol HCl (BYSTOLIC) 20 MG TABS Take 20 mg by mouth daily.     No current facility-administered medications for this visit.     Review of Systems Review of Systems  Constitutional: Negative.   Respiratory: Negative.   Cardiovascular: Negative.   Gastrointestinal: Positive for abdominal pain, diarrhea and vomiting.    Blood pressure 126/60, pulse 62, resp. rate 14, height 5\' 3"  (1.6 m), weight 160 lb (72.6 kg).  Physical Exam Physical Exam  Constitutional: She is oriented to person, place, and time. She appears well-developed and well-nourished.  Eyes: Conjunctivae are normal. No scleral icterus.  Neck: Neck supple.  Cardiovascular: Normal rate, regular rhythm and normal heart sounds.   Pulmonary/Chest: Effort normal and breath sounds normal.  Abdominal: Soft. Bowel sounds are normal. She exhibits no mass. There is no hepatomegaly. There is no tenderness.  Lymphadenopathy:    She has no cervical adenopathy.  Neurological: She is alert and oriented to person, place, and time.  Skin: Skin is warm  and dry.    Data Reviewed Notes and ultrasound reviewed  Gallstones with no signs of acute cholecystitis Assessment  Gallstones-symptomatic.  Reasonable to consider elective cholecystectomy. Discussed fully with pt and her daughter.     Plan    Laparoscopic Cholecystectomy with Intraoperative Cholangiogram. The procedure, including it's potential risks and complications (including but not limited to infection, bleeding, injury to intra-abdominal organs or bile ducts, bile leak, poor cosmetic result, sepsis and death) were discussed with the patient in detail. Non-operative options, including their inherent risks (acute calculous cholecystitis with possible  choledocholithiasis or gallstone pancreatitis, with the risk of ascending cholangitis, sepsis, and death) were discussed as well. The patient expressed and understanding of what we discussed and wishes to proceed with laparoscopic cholecystectomy. The patient further understands that if it is technically not possible, or it is unsafe to proceed laparoscopically, that I will convert to an open cholecystectomy.    The [facility]. She will pre admit at the hospital on 11/16/15. She is aware of dates, time, and instructions.   This information has been scribed by Dorathy DaftMarsha Hatch RN, BSN,BC.   Tarris Delbene G 11/11/2015, 10:18 AM

## 2015-11-11 NOTE — Patient Instructions (Addendum)
The patient is aware to call back for any questions or concerns.  Cholelithiasis Cholelithiasis (also called gallstones) is a form of gallbladder disease in which gallstones form in your gallbladder. The gallbladder is an organ that stores bile made in the liver, which helps digest fats. Gallstones begin as small crystals and slowly grow into stones. Gallstone pain occurs when the gallbladder spasms and a gallstone is blocking the duct. Pain can also occur when a stone passes out of the duct.  RISK FACTORS  Being female.   Having multiple pregnancies. Health care providers sometimes advise removing diseased gallbladders before future pregnancies.   Being obese.  Eating a diet heavy in fried foods and fat.   Being older than 60 years and increasing age.   Prolonged use of medicines containing female hormones.   Having diabetes mellitus.   Rapidly losing weight.   Having a family history of gallstones (heredity).  SYMPTOMS  Nausea.   Vomiting.  Abdominal pain.   Yellowing of the skin (jaundice).   Sudden pain. It may persist from several minutes to several hours.  Fever.   Tenderness to the touch. In some cases, when gallstones do not move into the bile duct, people have no pain or symptoms. These are called "silent" gallstones.  TREATMENT Silent gallstones do not need treatment. In severe cases, emergency surgery may be required. Options for treatment include:  Surgery to remove the gallbladder. This is the most common treatment.  Medicines. These do not always work and may take 6-12 months or more to work.  Shock wave treatment (extracorporeal biliary lithotripsy). In this treatment an ultrasound machine sends shock waves to the gallbladder to break gallstones into smaller pieces that can pass into the intestines or be dissolved by medicine. HOME CARE INSTRUCTIONS   Only take over-the-counter or prescription medicines for pain, discomfort, or fever as  directed by your health care provider.   Follow a low-fat diet until seen again by your health care provider. Fat causes the gallbladder to contract, which can result in pain.   Follow up with your health care provider as directed. Attacks are almost always recurrent and surgery is usually required for permanent treatment.  SEEK IMMEDIATE MEDICAL CARE IF:   Your pain increases and is not controlled by medicines.   You have a fever or persistent symptoms for more than 2-3 days.   You have a fever and your symptoms suddenly get worse.   You have persistent nausea and vomiting.  MAKE SURE YOU:   Understand these instructions.  Will watch your condition.  Will get help right away if you are not doing well or get worse.   This information is not intended to replace advice given to you by your health care provider. Make sure you discuss any questions you have with your health care provider.   Document Released: 02/17/2005 Document Revised: 10/24/2012 Document Reviewed: 08/15/2012 Elsevier Interactive Patient Education [employer]2016 [employer].  [facility]. She will pre admit at the hospital on 11/16/15. She is aware of dates, time, and instructions.

## 2015-11-16 ENCOUNTER — Encounter
Admission: RE | Admit: 2015-11-16 | Discharge: 2015-11-16 | Disposition: A | Discharge status: Home / Self Care | Payer: Medicare Other | Source: Ambulatory Visit | Attending: General Surgery | Admitting: General Surgery

## 2015-11-16 DIAGNOSIS — Z955 Presence of coronary angioplasty implant and graft: Secondary | ICD-10-CM | POA: Insufficient documentation

## 2015-11-16 DIAGNOSIS — Z01810 Encounter for preprocedural cardiovascular examination: Secondary | ICD-10-CM | POA: Diagnosis not present

## 2015-11-16 DIAGNOSIS — K8020 Calculus of gallbladder without cholecystitis without obstruction: Secondary | ICD-10-CM

## 2015-11-16 HISTORY — DX: Unspecified asthma, uncomplicated: J45.909

## 2015-11-16 HISTORY — DX: Hypothyroidism, unspecified: E03.9

## 2015-11-16 HISTORY — DX: Atherosclerotic heart disease of native coronary artery without angina pectoris: I25.10

## 2015-11-16 MED ORDER — CHLORHEXIDINE GLUCONATE CLOTH 2 % EX PADS
6.0000 | MEDICATED_PAD | Freq: Once | CUTANEOUS | Status: DC
  Filled 2015-11-16: qty 6

## 2015-11-16 NOTE — Patient Instructions (Signed)
  Your procedure is scheduled on: 11/20/15 Fri Report to Same Day Surgery 2nd floor medical mall To find out your arrival time please call [phone] between 1PM - 3PM on 11/19/15 Thurs Remember: Instructions that are not followed completely may result in serious medical risk, up to and including death, or upon the discretion of your surgeon and anesthesiologist your surgery may need to be rescheduled.    _x___ 1. Do not eat food or drink liquids after midnight. No gum chewing or hard candies.     __x__ 2. No Alcohol for 24 hours before or after surgery.   __x__3. No Smoking for 24 prior to surgery.   ____  4. Bring all medications with you on the day of surgery if instructed.    __x__ 5. Notify your doctor if there is any change in your medical condition     (cold, fever, infections).     Do not wear jewelry, make-up, hairpins, clips or nail polish.  Do not wear lotions, powders, or perfumes. You may wear deodorant.  Do not shave 48 hours prior to surgery. Men may shave face and neck.  Do not bring valuables to the hospital.    [facility] is not responsible for any belongings or valuables.               Contacts, dentures or bridgework may not be worn into surgery.  Leave your suitcase in the car. After surgery it may be brought to your room.  For patients admitted to the hospital, discharge time is determined by your treatment team.   Patients discharged the day of surgery will not be allowed to drive home.    Please read over the following fact sheets that you were given:   [facility] Preparing for Surgery and or MRSA Information   _x___ Take these medicines the morning of surgery with A SIP OF WATER:    1. amLODipine (NORVASC) 10 MG tablet  2.escitalopram (LEXAPRO) 20 MG tablet  3.hydrALAZINE (APRESOLINE) 50 MG tablet  4.levothyroxine (SYNTHROID, LEVOTHROID) 137 MCG tablet  5.Nebivolol HCl (BYSTOLIC) 20 MG TABS  6.  ____ Fleet Enema (as directed)   _x___ Use CHG Soap  or sage wipes as directed on instruction sheet   ____ Use inhalers on the day of surgery and bring to hospital day of surgery  ____ Stop metformin 2 days prior to surgery    _x___ Take 1/2 of usual insulin dose the night before surgery and none on the morning of           surgery.   __x__ Stop aspirin or coumadin, or plavix Stop Aspirin today  _x__ Stop Anti-inflammatories such as Advil, Aleve, Ibuprofen, Motrin, Naproxen,          Naprosyn, Goodies powders or aspirin products. Ok to take Tylenol.   ____ Stop supplements until after surgery.    ____ Bring C-Pap to the hospital.

## 2015-11-17 NOTE — Pre-Procedure Instructions (Signed)
REQUEST FOR MEDICAL CLEARANCE/ LABS WITH KT 5.7 CALLED AND FAXED TO DR Burney GauzeF KHAN. HAD TO LEAVE MESSSAGE ON NURSES'S LINE. ALSO CALLED AND FA[facility]ED [facility]

## 2015-11-18 DIAGNOSIS — R0602 Shortness of breath: Secondary | ICD-10-CM | POA: Diagnosis not present

## 2015-11-18 NOTE — Pre-Procedure Instructions (Addendum)
CLEARED BY DR Burney GauzeF KHAN MOD RISK. VERIFIED RECEIVED BY DR Evette CristalSANKAR. SPOKE TO REBECCA. KT AM SURGERY ORDERED.  [facility] AND RECHECK KT 11/19/15 IN OFFICE

## 2015-11-19 DIAGNOSIS — I10 Essential (primary) hypertension: Secondary | ICD-10-CM | POA: Diagnosis not present

## 2015-11-20 ENCOUNTER — Ambulatory Visit: Payer: Medicare Other | Admitting: Anesthesiology

## 2015-11-20 ENCOUNTER — Encounter
Admission: RE | Disposition: A | Discharge status: Home / Self Care | Payer: Self-pay | Source: Ambulatory Visit | Attending: General Surgery

## 2015-11-20 ENCOUNTER — Ambulatory Visit: Payer: Medicare Other

## 2015-11-20 ENCOUNTER — Ambulatory Visit
Admission: RE | Admit: 2015-11-20 | Discharge: 2015-11-20 | Disposition: A | Discharge status: Home / Self Care | Payer: Medicare Other | Source: Ambulatory Visit | Attending: General Surgery | Admitting: General Surgery

## 2015-11-20 DIAGNOSIS — E119 Type 2 diabetes mellitus without complications: Secondary | ICD-10-CM | POA: Insufficient documentation

## 2015-11-20 DIAGNOSIS — I2510 Atherosclerotic heart disease of native coronary artery without angina pectoris: Secondary | ICD-10-CM | POA: Diagnosis not present

## 2015-11-20 DIAGNOSIS — F329 Major depressive disorder, single episode, unspecified: Secondary | ICD-10-CM | POA: Insufficient documentation

## 2015-11-20 DIAGNOSIS — Z955 Presence of coronary angioplasty implant and graft: Secondary | ICD-10-CM | POA: Diagnosis not present

## 2015-11-20 DIAGNOSIS — E785 Hyperlipidemia, unspecified: Secondary | ICD-10-CM | POA: Insufficient documentation

## 2015-11-20 DIAGNOSIS — J45909 Unspecified asthma, uncomplicated: Secondary | ICD-10-CM | POA: Diagnosis not present

## 2015-11-20 DIAGNOSIS — K8020 Calculus of gallbladder without cholecystitis without obstruction: Secondary | ICD-10-CM | POA: Diagnosis not present

## 2015-11-20 DIAGNOSIS — Z794 Long term (current) use of insulin: Secondary | ICD-10-CM | POA: Diagnosis not present

## 2015-11-20 DIAGNOSIS — Z419 Encounter for procedure for purposes other than remedying health state, unspecified: Secondary | ICD-10-CM

## 2015-11-20 DIAGNOSIS — I10 Essential (primary) hypertension: Secondary | ICD-10-CM | POA: Insufficient documentation

## 2015-11-20 DIAGNOSIS — K8010 Calculus of gallbladder with chronic cholecystitis without obstruction: Secondary | ICD-10-CM | POA: Diagnosis not present

## 2015-11-20 DIAGNOSIS — Z7982 Long term (current) use of aspirin: Secondary | ICD-10-CM | POA: Diagnosis not present

## 2015-11-20 HISTORY — PX: CHOLECYSTECTOMY: SHX55

## 2015-11-20 LAB — GLUCOSE, CAPILLARY
GLUCOSE-CAPILLARY: 229 mg/dL — AB (ref 65–99)
Glucose-Capillary: 235 mg/dL — ABNORMAL HIGH (ref 65–99)

## 2015-11-20 SURGERY — LAPAROSCOPIC CHOLECYSTECTOMY WITH INTRAOPERATIVE CHOLANGIOGRAM
Anesthesia: General

## 2015-11-20 MED ORDER — FAMOTIDINE 20 MG PO TABS
ORAL_TABLET | ORAL | Status: AC
  Filled 2015-11-20: qty 1

## 2015-11-20 MED ORDER — ACETAMINOPHEN 10 MG/ML IV SOLN
INTRAVENOUS | Status: DC | PRN
  Administered 2015-11-20: 1000 mg via INTRAVENOUS

## 2015-11-20 MED ORDER — FENTANYL CITRATE (PF) 100 MCG/2ML IJ SOLN
INTRAMUSCULAR | Status: AC
  Administered 2015-11-20: 25 ug via INTRAVENOUS
  Filled 2015-11-20: qty 2

## 2015-11-20 MED ORDER — KETOROLAC TROMETHAMINE 30 MG/ML IJ SOLN
INTRAMUSCULAR | Status: DC | PRN
  Administered 2015-11-20: 15 mg via INTRAVENOUS

## 2015-11-20 MED ORDER — OXYCODONE-ACETAMINOPHEN 5-325 MG PO TABS: 1.0000 | ORAL_TABLET | ORAL | 0 refills | Status: DC | PRN

## 2015-11-20 MED ORDER — OXYCODONE HCL 5 MG PO TABS: 5.0000 mg | ORAL_TABLET | Freq: Once | ORAL | Status: DC | PRN

## 2015-11-20 MED ORDER — FENTANYL CITRATE (PF) 100 MCG/2ML IJ SOLN
INTRAMUSCULAR | Status: DC | PRN
  Administered 2015-11-20 (×3): 50 ug via INTRAVENOUS

## 2015-11-20 MED ORDER — SODIUM CHLORIDE 0.9 % IV SOLN
INTRAVENOUS | Status: DC
  Administered 2015-11-20: 10:00:00 via INTRAVENOUS

## 2015-11-20 MED ORDER — ONDANSETRON HCL 4 MG/2ML IJ SOLN
INTRAMUSCULAR | Status: DC | PRN
  Administered 2015-11-20: 4 mg via INTRAVENOUS

## 2015-11-20 MED ORDER — LIDOCAINE HCL (CARDIAC) 20 MG/ML IV SOLN
INTRAVENOUS | Status: DC | PRN
  Administered 2015-11-20: 70 mg via INTRATRACHEAL

## 2015-11-20 MED ORDER — CEFAZOLIN SODIUM-DEXTROSE 2-4 GM/100ML-% IV SOLN
2.0000 g | INTRAVENOUS | Status: AC
  Administered 2015-11-20: 2 g via INTRAVENOUS

## 2015-11-20 MED ORDER — SODIUM CHLORIDE 0.9 % IJ SOLN
INTRAMUSCULAR | Status: AC
  Filled 2015-11-20: qty 50

## 2015-11-20 MED ORDER — ACETAMINOPHEN 10 MG/ML IV SOLN
INTRAVENOUS | Status: AC
  Filled 2015-11-20: qty 100

## 2015-11-20 MED ORDER — LACTATED RINGERS IV SOLN
INTRAVENOUS | Status: DC | PRN
  Administered 2015-11-20: 11:00:00 via INTRAVENOUS

## 2015-11-20 MED ORDER — SUGAMMADEX SODIUM 200 MG/2ML IV SOLN
INTRAVENOUS | Status: DC | PRN
  Administered 2015-11-20: 150 mg via INTRAVENOUS

## 2015-11-20 MED ORDER — MEPERIDINE HCL 25 MG/ML IJ SOLN: 6.2500 mg | INTRAMUSCULAR | Status: DC | PRN

## 2015-11-20 MED ORDER — ROCURONIUM BROMIDE 100 MG/10ML IV SOLN
INTRAVENOUS | Status: DC | PRN
  Administered 2015-11-20: 45 mg via INTRAVENOUS
  Administered 2015-11-20: 5 mg via INTRAVENOUS

## 2015-11-20 MED ORDER — SODIUM CHLORIDE 0.9 % IV SOLN
INTRAVENOUS | Status: DC | PRN
  Administered 2015-11-20: 10 mL

## 2015-11-20 MED ORDER — PHENYLEPHRINE HCL 10 MG/ML IJ SOLN
INTRAMUSCULAR | Status: DC | PRN
  Administered 2015-11-20: 100 ug via INTRAVENOUS

## 2015-11-20 MED ORDER — OXYCODONE HCL 5 MG/5ML PO SOLN: 5.0000 mg | Freq: Once | ORAL | Status: DC | PRN

## 2015-11-20 MED ORDER — FENTANYL CITRATE (PF) 100 MCG/2ML IJ SOLN
25.0000 ug | INTRAMUSCULAR | Status: DC | PRN
  Administered 2015-11-20 (×2): 25 ug via INTRAVENOUS

## 2015-11-20 MED ORDER — FAMOTIDINE 20 MG PO TABS
20.0000 mg | ORAL_TABLET | Freq: Once | ORAL | Status: AC
  Administered 2015-11-20: 20 mg via ORAL

## 2015-11-20 MED ORDER — PROMETHAZINE HCL 25 MG/ML IJ SOLN: 6.2500 mg | INTRAMUSCULAR | Status: DC | PRN

## 2015-11-20 MED ORDER — PROPOFOL 10 MG/ML IV BOLUS
INTRAVENOUS | Status: DC | PRN
  Administered 2015-11-20: 100 mg via INTRAVENOUS

## 2015-11-20 SURGICAL SUPPLY — 44 items
AGENT HMST MTR 8 SURGIFLO (HEMOSTASIS)
ANCHOR TIS RET SYS 235ML (MISCELLANEOUS) ×3 IMPLANT
APPLICATOR SURGIFLO (MISCELLANEOUS) IMPLANT
APPLIER CLIP LOGIC TI 5 (MISCELLANEOUS) ×3 IMPLANT
APR CLP MED LRG 33X5 (MISCELLANEOUS) ×1
BAG TISS RTRVL C235 10X14 (MISCELLANEOUS) ×1
BLADE SURG 11 STRL SS SAFETY (MISCELLANEOUS) ×3 IMPLANT
CANISTER SUCT 1200ML W/VALVE (MISCELLANEOUS) ×3 IMPLANT
CANNULA DILATOR 10 W/SLV (CANNULA) ×2 IMPLANT
CANNULA DILATOR 10MM W/SLV (CANNULA) ×1
CATH CHOLANG 76X19 KUMAR (CATHETERS) ×3 IMPLANT
CHLORAPREP W/TINT 26ML (MISCELLANEOUS) ×3 IMPLANT
CLOSURE WOUND 1/2 X4 (GAUZE/BANDAGES/DRESSINGS) ×1
DEFOGGER SCOPE WARMER CLEARIFY (MISCELLANEOUS) ×3 IMPLANT
DRAPE C-ARM XRAY 36X54 (DRAPES) ×3 IMPLANT
DRAPE INCISE IOBAN 66X45 STRL (DRAPES) ×3 IMPLANT
DRESSING TELFA 4X3 1S ST N-ADH (GAUZE/BANDAGES/DRESSINGS) ×3 IMPLANT
DRSG TEGADERM 2-3/8X2-3/4 SM (GAUZE/BANDAGES/DRESSINGS) ×12 IMPLANT
ELECT REM PT RETURN 9FT ADLT (ELECTROSURGICAL) ×3
ELECTRODE REM PT RTRN 9FT ADLT (ELECTROSURGICAL) ×1 IMPLANT
GLOVE BIO SURGEON STRL SZ7 (GLOVE) ×3 IMPLANT
GOWN STRL REUS W/ TWL LRG LVL3 (GOWN DISPOSABLE) ×3 IMPLANT
GOWN STRL REUS W/TWL LRG LVL3 (GOWN DISPOSABLE) ×9
GRASPER SUT TROCAR 14GX15 (MISCELLANEOUS) ×3 IMPLANT
HEMOSTAT SURGICEL 2X3 (HEMOSTASIS) IMPLANT
IRRIGATION STRYKERFLOW (MISCELLANEOUS) ×1 IMPLANT
IRRIGATOR STRYKERFLOW (MISCELLANEOUS) ×3
IV LACTATED RINGERS 1000ML (IV SOLUTION) ×3 IMPLANT
KIT RM TURNOVER STRD PROC AR (KITS) ×3 IMPLANT
LABEL OR SOLS (LABEL) ×3 IMPLANT
NDL INSUFF ACCESS 14 VERSASTEP (NEEDLE) ×3 IMPLANT
PACK LAP CHOLECYSTECTOMY (MISCELLANEOUS) ×3 IMPLANT
SCISSORS METZENBAUM CVD 33 (INSTRUMENTS) ×3 IMPLANT
SLEEVE ENDOPATH XCEL 5M (ENDOMECHANICALS) ×6 IMPLANT
SPOGE SURGIFLO 8M (HEMOSTASIS)
SPONGE SURGIFLO 8M (HEMOSTASIS) IMPLANT
STRIP CLOSURE SKIN 1/2X4 (GAUZE/BANDAGES/DRESSINGS) ×2 IMPLANT
SUT VIC AB 0 SH 27 (SUTURE) ×3 IMPLANT
SUT VIC AB 4-0 FS2 27 (SUTURE) ×3 IMPLANT
SWABSTK COMLB BENZOIN TINCTURE (MISCELLANEOUS) ×3 IMPLANT
TROCAR XCEL NON-BLD 11X100MML (ENDOMECHANICALS) ×3 IMPLANT
TROCAR XCEL NON-BLD 5MMX100MML (ENDOMECHANICALS) ×3 IMPLANT
TROCAR XCEL UNIV SLVE 11M 100M (ENDOMECHANICALS) ×2 IMPLANT
TUBING INSUFFLATOR HI FLOW (MISCELLANEOUS) ×3 IMPLANT

## 2015-11-20 NOTE — Discharge Instructions (Signed)

## 2015-11-20 NOTE — Interval H&P Note (Signed)
History and Physical Interval Note:  11/20/2015 10:45 AM  Evelyn Stark  has presented today for surgery, with the diagnosis of GALLSTONES  The various methods of treatment have been discussed with the patient and family. After consideration of risks, benefits and other options for treatment, the patient has consented to  Procedure(s): LAPAROSCOPIC CHOLECYSTECTOMY WITH INTRAOPERATIVE CHOLANGIOGRAM (N/A) as a surgical intervention .  The patient's history has been reviewed, patient examined, no change in status, stable for surgery.  I have reviewed the patient's chart and labs.  Questions were answered to the patient's satisfaction.     Viren Lebeau G

## 2015-11-20 NOTE — Op Note (Signed)
Preop diagnosis: Cholelithiasis with chronic cholecystitis  Post op diagnosis: Same  Operation: Laparoscopy cholecystectomy and intraoperative cholangiogram  Surgeon: Kathreen CosierS. G. Rechelle Niebla   Assistant: Para MarchJanie Benson, RN, FA   Anesthesia: Gen.  Complications: None  EBL: Less than 20 mL  Drains: None  Description: Patient was put to sleep the supine position the operating table and the abdomen prepped and draped as sterile field. Timeout was performed. Initial port incision was on the inferior lip of the umbilicus and a Veress needle position in the peritoneal cavity verified of the hanging drop method. Pneumoperitoneum was obtained and subsequently 11 mm port was placed. Camera was introduced in epigastric and 2 lateral 5 mm ports placed. Gallbladder was noted be moderately distended in a little thickened in its wall but no adhesions of findings of acute cholecystitis. With careful traction the cystic duct area was dissected and the common bile duct was identified which appeared normal in size. After the cystic duct was freed the Kumar clamp and catheter were positioned and cholangiogram completed. There was good visualization of both proximal and distal bile duct with no obstruction to flow or retained stones. The cystic duct was hemoclipped and cut and the cystic artery was identified freed hemoclipped and cut. Gallbladder was dissected free from its bed using cautery for control of bleeding. Following this the hepatic gallbladder bed was inspected and noted to be free of any bleeding or any leaks and the clips were intact. Small amount of fluid was used to irrigate on the right upper quadrant and suctioned out. Gallbladder was placed in a retrieval bag and brought out through the umbilical port site. The fascial opening the umbilicus was closed with 0 Vicryl. Pneumoperitoneum was released and after all ports were removed. Skin incisions closed with subcuticular 4-0 Vicryl reinforced with Steri-Strips.  Telfa and Tegaderm dressings placed and patient subsequently extubated and returned recovery room stable condition

## 2015-11-20 NOTE — Anesthesia Postprocedure Evaluation (Signed)
Anesthesia Post Note  Patient: Evelyn Stark  Procedure(s) Performed: Procedure(s) (LRB): LAPAROSCOPIC CHOLECYSTECTOMY WITH INTRAOPERATIVE CHOLANGIOGRAM (N/A)  Patient location during evaluation: PACU Anesthesia Type: General Level of consciousness: patient uncooperative and awake Pain management: pain level controlled Vital Signs Assessment: post-procedure vital signs reviewed and stable Respiratory status: spontaneous breathing, nonlabored ventilation and non-rebreather facemask (albuterol treatment ordered) Cardiovascular status: blood pressure returned to baseline : unable to assess- no  interpreter present. no s/s pain. Anesthetic complications: no    Last Vitals:  Vitals:   11/20/15 0934  BP: (!) 143/53  Pulse: 69  Resp: 18  Temp: (!) 36 C    Last Pain:  Vitals:   11/20/15 0934  TempSrc: Tympanic  PainSc: 5                  Mitzie NaJennifer Evangeline Utley

## 2015-11-20 NOTE — Transfer of Care (Signed)
Immediate Anesthesia Transfer of Care Note  Patient: Evelyn Stark  Procedure(s) Performed: Procedure(s): LAPAROSCOPIC CHOLECYSTECTOMY WITH INTRAOPERATIVE CHOLANGIOGRAM (N/A)  Patient Location: PACU  Anesthesia Type:General  Level of Consciousness: awake, patient cooperative and responds to stimulation  Airway & Oxygen Therapy: spontaneous rr simple mask  Post-op Assessment: Report given to RN, Post -op Vital signs reviewed and stable and Patient moving all extremities X 4  Post vital signs: stable  Last Vitals:  Vitals:   11/20/15 0934 11/20/15 1250  BP: (!) 143/53 (!) 137/54  Pulse: 69 76  Resp: 18 (!) 22  Temp: (!) 36 C 36.8 C    Last Pain:  Vitals:   11/20/15 0934  TempSrc: Tympanic  PainSc: 5          Complications: No apparent anesthesia complications

## 2015-11-20 NOTE — H&P (View-Only) (Signed)
Patient ID: Evelyn Stark, female   DOB: 22-Feb-1941, 75 y.o.   MRN: [id number]  Chief Complaint  Patient presents with  . Other    gall stones    HPI Evelyn Stark is a 75 y.o. female.  Here today for evaluation of gall stones. She has been having abdominal pain for about three years. The pain is located in her right upper quadrant and radiates to her back. Pain last for about a day or so. When she eats greens the pain comes more often. Ultrasound was performed last week.  I have reviewed the history of present illness with the patient.  HPI  Past Medical History:  Diagnosis Date  . Depression   . Diabetes mellitus without complication (HCC)   . Hyperlipidemia   . Hypertension     Past Surgical History:  Procedure Laterality Date  . COLONOSCOPY  2000  . EYE SURGERY    . LEFT HEART CATHETERIZATION WITH CORONARY ANGIOGRAM N/A 11/27/2012   Procedure: LEFT HEART CATHETERIZATION WITH CORONARY ANGIOGRAM;  Surgeon: Robynn Pane, MD;  Location: [facility] CATH LAB;  Service: Cardiovascular;  Laterality: N/A;  . MINOR HEMORRHOIDECTOMY      No family history on file.  Social History Social History  Substance Use Topics  . Smoking status: Never Smoker  . Smokeless tobacco: Never Used  . Alcohol use No    No Known Allergies  Current Outpatient Prescriptions  Medication Sig Dispense Refill  . ALPRAZolam (XANAX) 0.25 MG tablet Take 0.25 mg by mouth 2 (two) times daily as needed for sleep or anxiety.    Marland Kitchen amLODipine (NORVASC) 10 MG tablet Take 10 mg by mouth 2 (two) times daily.    Marland Kitchen aspirin EC 81 MG tablet Take 81 mg by mouth daily.    Marland Kitchen atorvastatin (LIPITOR) 20 MG tablet Take 20 mg by mouth daily.    Marland Kitchen escitalopram (LEXAPRO) 20 MG tablet Take 10 mg by mouth daily.    Marland Kitchen glimepiride (AMARYL) 4 MG tablet Take 4 mg by mouth 2 (two) times daily.    . hydrALAZINE (APRESOLINE) 50 MG tablet Take 100 mg by mouth 3 (three) times daily.     . insulin glargine (LANTUS) 100 UNIT/ML injection Inject 20  Units into the skin daily.    . isosorbide mononitrate (IMDUR) 30 MG 24 hr tablet Take 30 mg by mouth daily.    Marland Kitchen levothyroxine (SYNTHROID, LEVOTHROID) 137 MCG tablet Take 137 mcg by mouth daily before breakfast.    . Nebivolol HCl (BYSTOLIC) 20 MG TABS Take 20 mg by mouth daily.     No current facility-administered medications for this visit.     Review of Systems Review of Systems  Constitutional: Negative.   Respiratory: Negative.   Cardiovascular: Negative.   Gastrointestinal: Positive for abdominal pain, diarrhea and vomiting.    Blood pressure 126/60, pulse 62, resp. rate 14, height 5\' 3"  (1.6 m), weight 160 lb (72.6 kg).  Physical Exam Physical Exam  Constitutional: She is oriented to person, place, and time. She appears well-developed and well-nourished.  Eyes: Conjunctivae are normal. No scleral icterus.  Neck: Neck supple.  Cardiovascular: Normal rate, regular rhythm and normal heart sounds.   Pulmonary/Chest: Effort normal and breath sounds normal.  Abdominal: Soft. Bowel sounds are normal. She exhibits no mass. There is no hepatomegaly. There is no tenderness.  Lymphadenopathy:    She has no cervical adenopathy.  Neurological: She is alert and oriented to person, place, and time.  Skin: Skin is warm  and dry.    Data Reviewed Notes and ultrasound reviewed  Gallstones with no signs of acute cholecystitis Assessment  Gallstones-symptomatic.  Reasonable to consider elective cholecystectomy. Discussed fully with pt and her daughter.     Plan    Laparoscopic Cholecystectomy with Intraoperative Cholangiogram. The procedure, including it's potential risks and complications (including but not limited to infection, bleeding, injury to intra-abdominal organs or bile ducts, bile leak, poor cosmetic result, sepsis and death) were discussed with the patient in detail. Non-operative options, including their inherent risks (acute calculous cholecystitis with possible  choledocholithiasis or gallstone pancreatitis, with the risk of ascending cholangitis, sepsis, and death) were discussed as well. The patient expressed and understanding of what we discussed and wishes to proceed with laparoscopic cholecystectomy. The patient further understands that if it is technically not possible, or it is unsafe to proceed laparoscopically, that I will convert to an open cholecystectomy.    The patient [facility]. She will pre admit at the hospital on 11/16/15. She is aware of dates, time, and instructions.   This information has been scribed by Dorathy DaftMarsha Hatch RN, BSN,BC.   SANKAR,SEEPLAPUTHUR G 11/11/2015, 10:18 AM

## 2015-11-20 NOTE — Anesthesia Procedure Notes (Addendum)
Procedure Name: Intubation Date/Time: 11/20/2015 11:45 AM Performed by: Lynnae JanuaryEASTERLING, Arjun Hard LYNNE Pre-anesthesia Checklist: Patient identified, Emergency Drugs available, Suction available, Patient being monitored and Timeout performed Patient Re-evaluated:Patient Re-evaluated prior to inductionOxygen Delivery Method: Circle system utilized Preoxygenation: Pre-oxygenation with 100% oxygen Intubation Type: IV induction Ventilation: Mask ventilation without difficulty Laryngoscope Size: Mac and 3 Grade View: Grade I Nasal Tubes: Right Tube size: 7.0 mm Number of attempts: 1 Airway Equipment and Method: Stylet Placement Confirmation: ETT inserted through vocal cords under direct vision and CO2 detector Secured at: 21 cm Tube secured with: Tape Dental Injury: Teeth and Oropharynx as per pre-operative assessment

## 2015-11-20 NOTE — Anesthesia Preprocedure Evaluation (Signed)
Anesthesia Evaluation  Patient identified by MRN, date of birth, ID band Patient awake    Reviewed: Allergy & Precautions, NPO status , Patient's Chart, lab work & pertinent test results  History of Anesthesia Complications Negative for: history of anesthetic complications  Airway Mallampati: II  TM Distance: >3 FB Neck ROM: Full    Dental  (+) Implants   Pulmonary asthma , neg sleep apnea, neg COPD,    breath sounds clear to auscultation- rhonchi (-) wheezing      Cardiovascular Exercise Tolerance: Good hypertension, Pt. on medications and Pt. on home beta blockers + CAD and + Cardiac Stents (angioplasty and stents done after positive stress test)   Rhythm:Regular Rate:Normal - Systolic murmurs and - Diastolic murmurs    Neuro/Psych  Headaches, Depression    GI/Hepatic negative GI ROS, Neg liver ROS,   Endo/Other  diabetes, Type 2, Insulin Dependent, Oral Hypoglycemic AgentsHypothyroidism   Renal/GU negative Renal ROS     Musculoskeletal negative musculoskeletal ROS (+)   Abdominal (+) - obese,   Peds  Hematology negative hematology ROS (+)   Anesthesia Other Findings Past Medical History: No date: Asthma No date: Coronary artery disease No date: Depression No date: Diabetes mellitus without complication (HCC) No date: Hyperlipidemia No date: Hypertension No date: Hypothyroidism   Reproductive/Obstetrics                             Anesthesia Physical Anesthesia Plan  ASA: III  Anesthesia Plan: General   Post-op Pain Management:    Induction: Intravenous  Airway Management Planned: Oral ETT  Additional Equipment:   Intra-op Plan:   Post-operative Plan: Extubation in OR  Informed Consent: I have reviewed the patients History and Physical, chart, labs and discussed the procedure including the risks, benefits and alternatives for the proposed anesthesia with the patient  or authorized representative who has indicated his/her understanding and acceptance.   Dental advisory given  Plan Discussed with: CRNA and Anesthesiologist  Anesthesia Plan Comments:         Anesthesia Quick Evaluation

## 2015-11-23 LAB — SURGICAL PATHOLOGY

## 2015-11-26 ENCOUNTER — Encounter: Payer: Self-pay | Admitting: General Surgery

## 2015-12-14 ENCOUNTER — Other Ambulatory Visit: Payer: Self-pay | Admitting: Internal Medicine

## 2015-12-14 DIAGNOSIS — R55 Syncope and collapse: Secondary | ICD-10-CM | POA: Diagnosis not present

## 2015-12-14 DIAGNOSIS — R0602 Shortness of breath: Secondary | ICD-10-CM

## 2015-12-14 DIAGNOSIS — J9611 Chronic respiratory failure with hypoxia: Secondary | ICD-10-CM | POA: Diagnosis not present

## 2015-12-14 DIAGNOSIS — E1165 Type 2 diabetes mellitus with hyperglycemia: Secondary | ICD-10-CM | POA: Diagnosis not present

## 2015-12-14 DIAGNOSIS — I10 Essential (primary) hypertension: Secondary | ICD-10-CM | POA: Diagnosis not present

## 2015-12-14 DIAGNOSIS — R42 Dizziness and giddiness: Secondary | ICD-10-CM | POA: Diagnosis not present

## 2015-12-16 ENCOUNTER — Ambulatory Visit: Admission: RE | Admit: 2015-12-16 | Payer: Medicare Other | Source: Ambulatory Visit

## 2015-12-16 ENCOUNTER — Ambulatory Visit
Admission: RE | Admit: 2015-12-16 | Discharge: 2015-12-16 | Disposition: A | Discharge status: Home / Self Care | Payer: Medicare Other | Source: Ambulatory Visit | Attending: Internal Medicine | Admitting: Internal Medicine

## 2015-12-16 DIAGNOSIS — R0602 Shortness of breath: Secondary | ICD-10-CM | POA: Insufficient documentation

## 2015-12-16 DIAGNOSIS — I2510 Atherosclerotic heart disease of native coronary artery without angina pectoris: Secondary | ICD-10-CM | POA: Diagnosis not present

## 2015-12-16 DIAGNOSIS — R918 Other nonspecific abnormal finding of lung field: Secondary | ICD-10-CM | POA: Diagnosis not present

## 2015-12-16 DIAGNOSIS — J479 Bronchiectasis, uncomplicated: Secondary | ICD-10-CM | POA: Insufficient documentation

## 2015-12-16 DIAGNOSIS — I700 Atherosclerosis of aorta: Secondary | ICD-10-CM | POA: Insufficient documentation

## 2016-02-03 DIAGNOSIS — E1165 Type 2 diabetes mellitus with hyperglycemia: Secondary | ICD-10-CM | POA: Diagnosis not present

## 2016-02-03 DIAGNOSIS — I10 Essential (primary) hypertension: Secondary | ICD-10-CM | POA: Diagnosis not present

## 2017-05-02 ENCOUNTER — Other Ambulatory Visit: Payer: Self-pay

## 2017-05-02 MED ORDER — POLYSACCHARIDE IRON COMPLEX 150 MG PO CAPS: 150.0000 mg | ORAL_CAPSULE | Freq: Every day | ORAL | 4 refills | Status: DC

## 2017-06-02 ENCOUNTER — Telehealth: Payer: Self-pay

## 2017-06-02 NOTE — Telephone Encounter (Signed)
A prescription for - contour test strips blood sugar testing TID #100 E11.65 -lancets blood sugar testing TID #100 E11.65  Were sent in for pt on 04/26/2017 to [facility] (faxed to [phone]).  Pt uses novolog insulin.   Faxed in by Henry Scheinitania Banks with the above information.

## 2017-06-14 ENCOUNTER — Other Ambulatory Visit: Payer: Self-pay | Admitting: Internal Medicine

## 2017-06-14 MED ORDER — PIMECROLIMUS 1 % EX CREA: TOPICAL_CREAM | Freq: Two times a day (BID) | CUTANEOUS | 0 refills | Status: DC

## 2017-06-14 MED ORDER — NIFEREX PO TABS: 1.0000 | ORAL_TABLET | ORAL | 1 refills | Status: AC

## 2017-08-15 ENCOUNTER — Other Ambulatory Visit: Payer: Self-pay

## 2017-09-29 ENCOUNTER — Telehealth: Payer: Self-pay | Admitting: Internal Medicine

## 2017-09-29 ENCOUNTER — Other Ambulatory Visit: Payer: Self-pay | Admitting: Internal Medicine

## 2017-09-29 ENCOUNTER — Other Ambulatory Visit: Payer: Self-pay

## 2017-09-29 MED ORDER — CANAGLIFLOZIN 300 MG PO TABS: 300.0000 mg | ORAL_TABLET | Freq: Every day | ORAL | 3 refills | Status: DC

## 2017-09-29 MED ORDER — SAXAGLIPTIN HCL 5 MG PO TABS: 5.0000 mg | ORAL_TABLET | Freq: Every day | ORAL | 2 refills | Status: DC

## 2017-09-29 MED ORDER — BASAGLAR KWIKPEN 100 UNIT/ML ~~LOC~~ SOPN: 50.0000 [IU] | PEN_INJECTOR | Freq: Every day | SUBCUTANEOUS | 3 refills | Status: DC

## 2017-09-29 MED ORDER — PIMECROLIMUS 1 % EX CREA: TOPICAL_CREAM | Freq: Two times a day (BID) | CUTANEOUS | 2 refills | Status: DC

## 2017-09-29 MED ORDER — CONTOUR BLOOD GLUCOSE SYSTEM W/DEVICE KIT: PACK | 0 refills | Status: AC

## 2017-09-29 MED ORDER — GLIMEPIRIDE 4 MG PO TABS: ORAL_TABLET | ORAL | 3 refills | Status: DC

## 2017-09-29 MED ORDER — LEVOTHYROXINE SODIUM 137 MCG PO TABS
137.0000 ug | ORAL_TABLET | Freq: Every day | ORAL | 3 refills | Status: DC
Start: 2017-09-29 — End: 2018-10-03

## 2017-09-29 MED ORDER — ATORVASTATIN CALCIUM 20 MG PO TABS: 20.0000 mg | ORAL_TABLET | Freq: Every day | ORAL | 3 refills | Status: DC

## 2017-09-29 MED ORDER — HYDRALAZINE HCL 100 MG PO TABS: 100.0000 mg | ORAL_TABLET | Freq: Three times a day (TID) | ORAL | 1 refills | Status: DC

## 2017-09-29 MED ORDER — INSULIN PEN NEEDLE 32G X 4 MM MISC: 5 refills | Status: DC

## 2017-09-29 MED ORDER — NEBIVOLOL HCL 20 MG PO TABS: ORAL_TABLET | ORAL | 3 refills | Status: DC

## 2017-09-29 MED ORDER — BUDESONIDE-FORMOTEROL FUMARATE 160-4.5 MCG/ACT IN AERO: 2.0000 | INHALATION_SPRAY | Freq: Two times a day (BID) | RESPIRATORY_TRACT | 3 refills | Status: DC | PRN

## 2017-09-29 MED ORDER — ESOMEPRAZOLE MAGNESIUM 40 MG PO CPDR: 40.0000 mg | DELAYED_RELEASE_CAPSULE | Freq: Every day | ORAL | 3 refills | Status: DC

## 2017-09-29 MED ORDER — MIRTAZAPINE 15 MG PO TABS: ORAL_TABLET | ORAL | 3 refills | Status: DC

## 2017-09-29 MED ORDER — NYSTATIN 100000 UNIT/ML MT SUSP: 5.0000 mL | Freq: Four times a day (QID) | OROMUCOSAL | 1 refills | Status: DC

## 2017-09-29 MED ORDER — AMLODIPINE BESYLATE 10 MG PO TABS: 10.0000 mg | ORAL_TABLET | Freq: Two times a day (BID) | ORAL | 3 refills | Status: DC

## 2017-09-29 MED ORDER — TRAZODONE HCL 50 MG PO TABS: ORAL_TABLET | ORAL | 3 refills | Status: DC

## 2017-09-29 MED ORDER — ESCITALOPRAM OXALATE 10 MG PO TABS: ORAL_TABLET | ORAL | 3 refills | Status: DC

## 2017-09-29 MED ORDER — GLUCOSE BLOOD VI STRP: 1.0000 | ORAL_STRIP | Freq: Three times a day (TID) | 3 refills | Status: DC

## 2017-09-29 NOTE — Telephone Encounter (Signed)
Medication for onglyza and invokana was approved by medicare 07/01/2017 - 09/29/2018

## 2017-10-03 ENCOUNTER — Telehealth: Payer: Self-pay | Admitting: Nurse Practitioner

## 2017-10-03 NOTE — Telephone Encounter (Signed)
Prior auth for elidel has been approved

## 2017-12-23 ENCOUNTER — Other Ambulatory Visit: Payer: Self-pay | Admitting: Internal Medicine

## 2017-12-23 MED ORDER — CANAGLIFLOZIN 300 MG PO TABS: 300.0000 mg | ORAL_TABLET | Freq: Every day | ORAL | 3 refills | Status: DC

## 2018-05-15 ENCOUNTER — Other Ambulatory Visit: Payer: Self-pay | Admitting: Internal Medicine

## 2018-08-07 IMAGING — CR DG CHOLANGIOGRAM OPERATIVE
1 series · 10 of 10 positions shown · non-contrast
Comparison: None

CLINICAL DATA: Intraoperative cholangiogram during laparoscopic
cholecystectomy.

EXAM:
INTRAOPERATIVE CHOLANGIOGRAM
FLUOROSCOPY TIME:  29 seconds

[Series 7: cont. · 10 of 119 frames shown]
[frame 1/119]
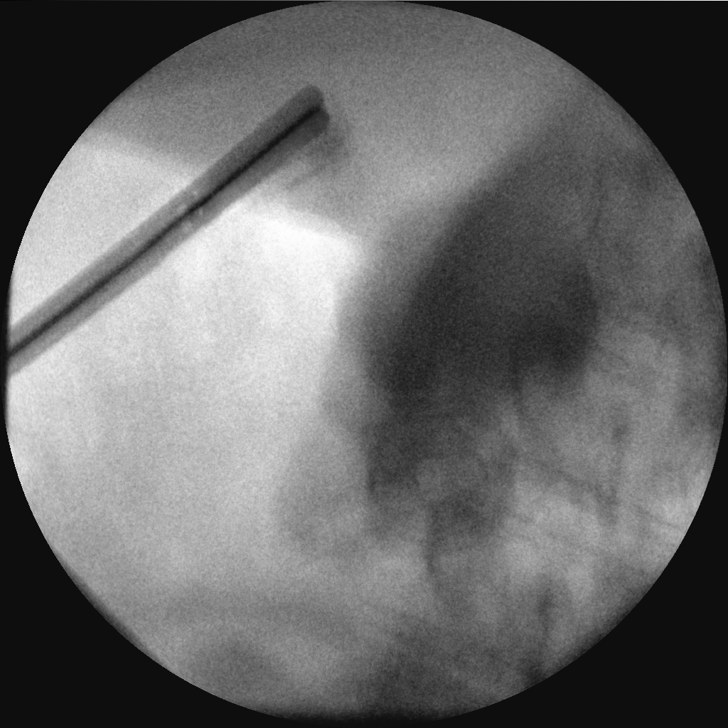
[frame 14/119]
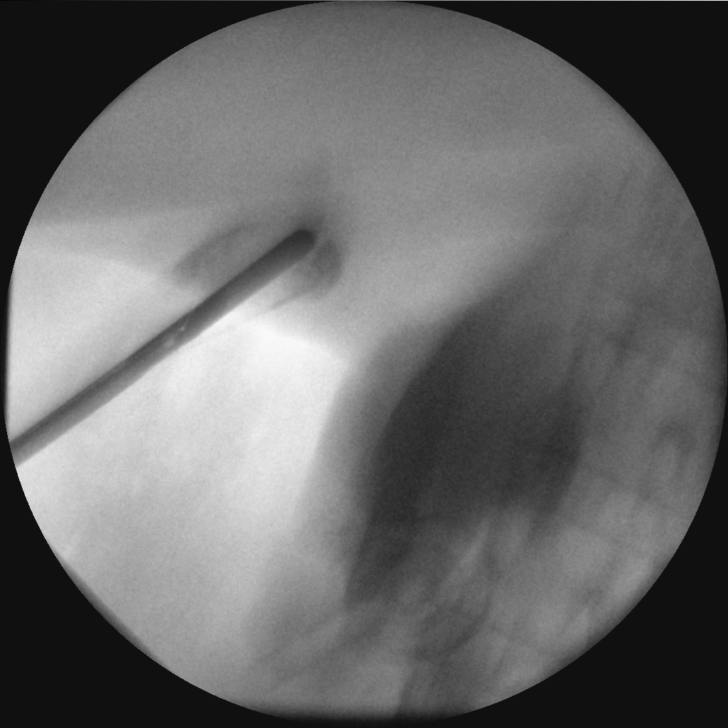
[frame 27/119]
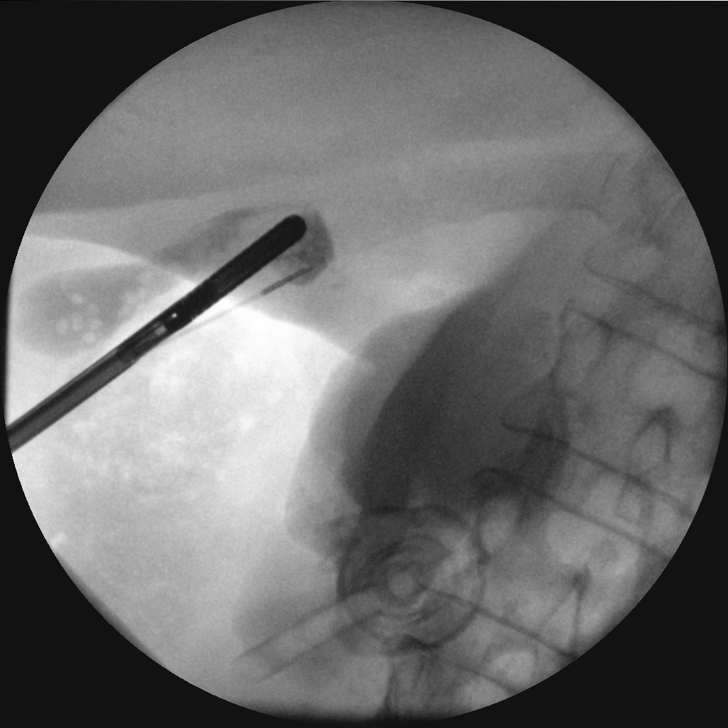
[frame 40/119]
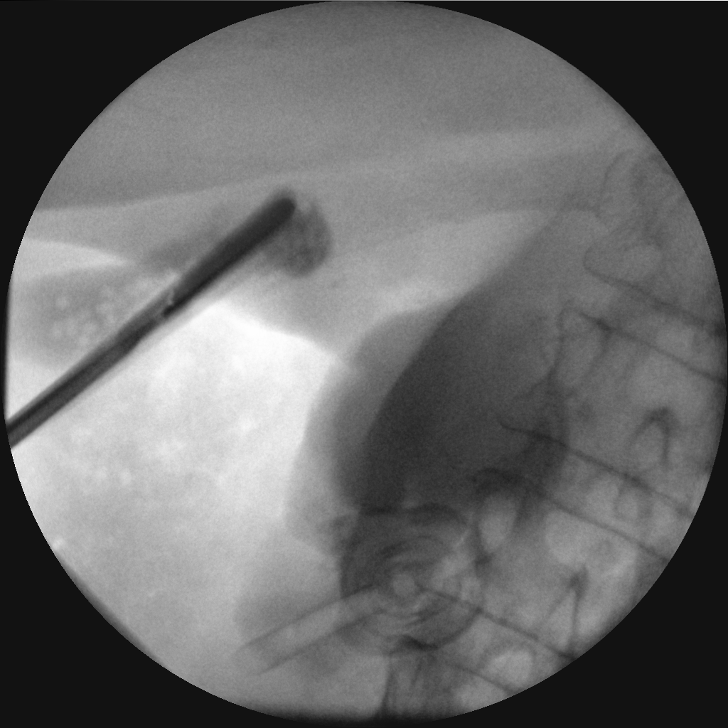
[frame 53/119]
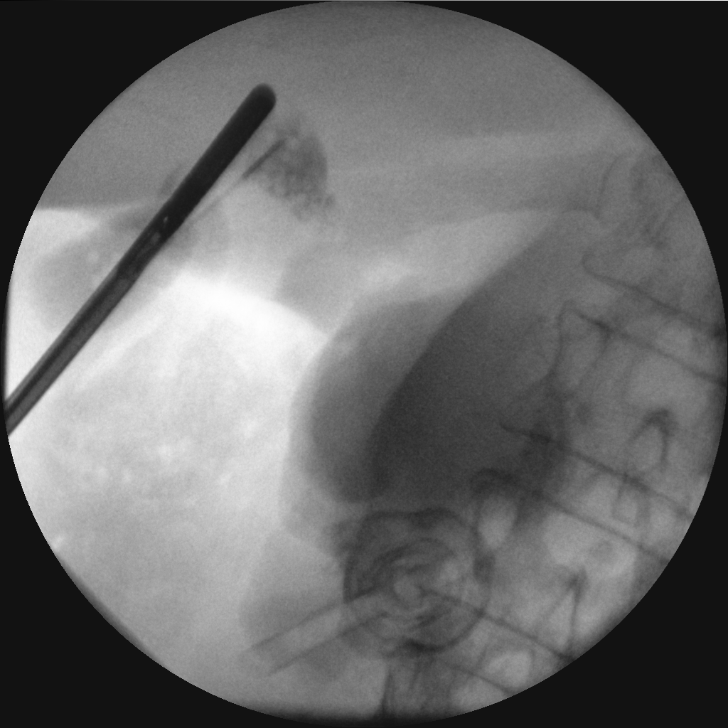
[frame 66/119]
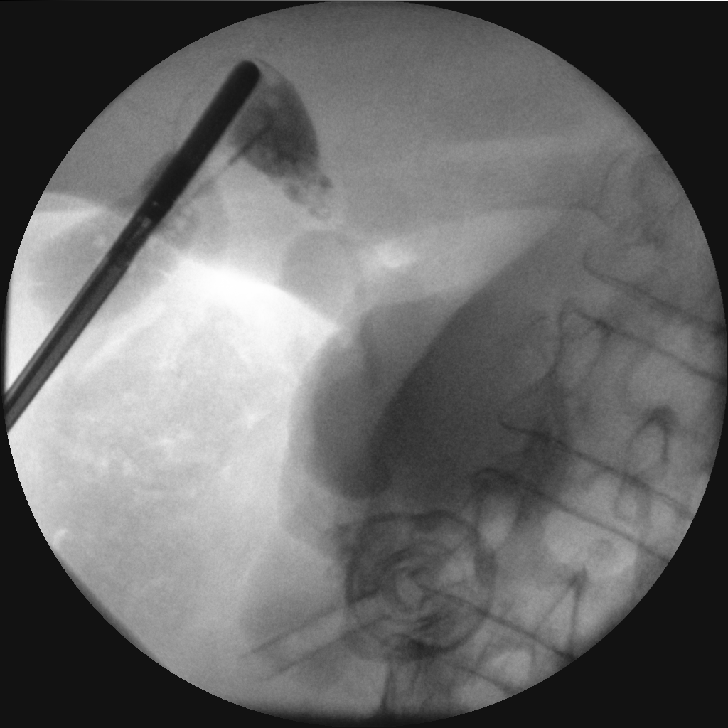
[frame 79/119]
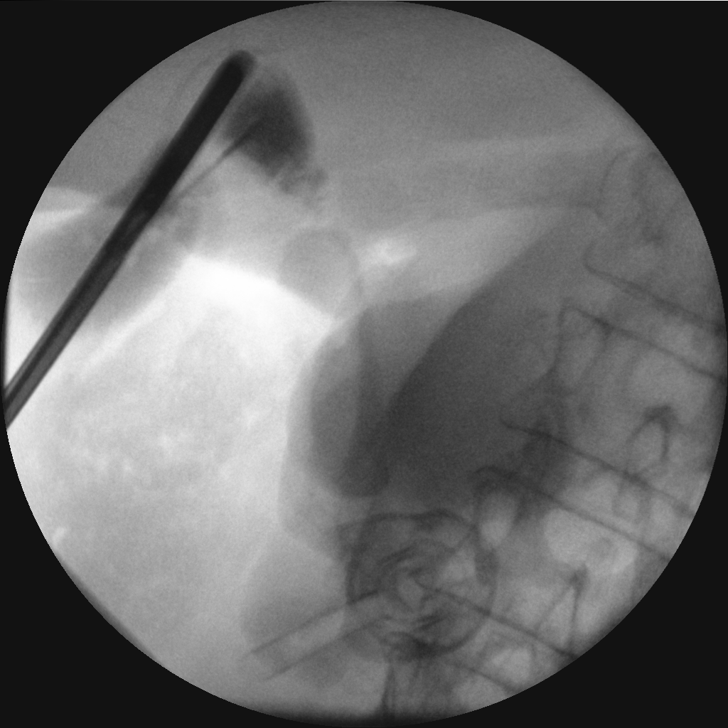
[frame 92/119]
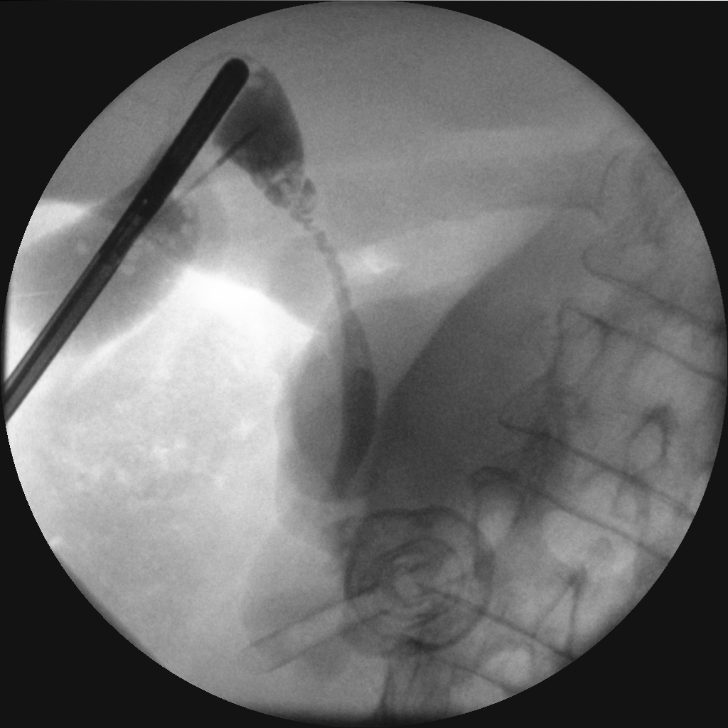
[frame 105/119]
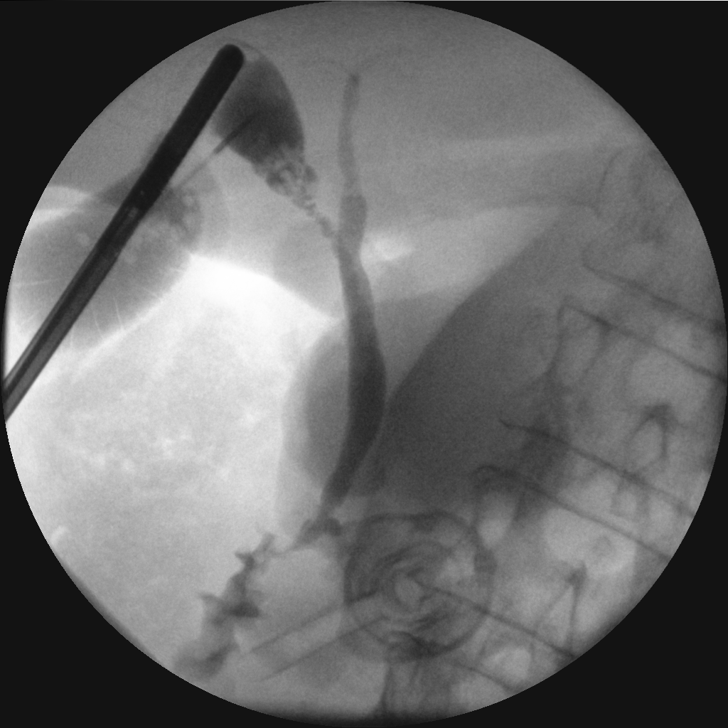
[frame 119/119]
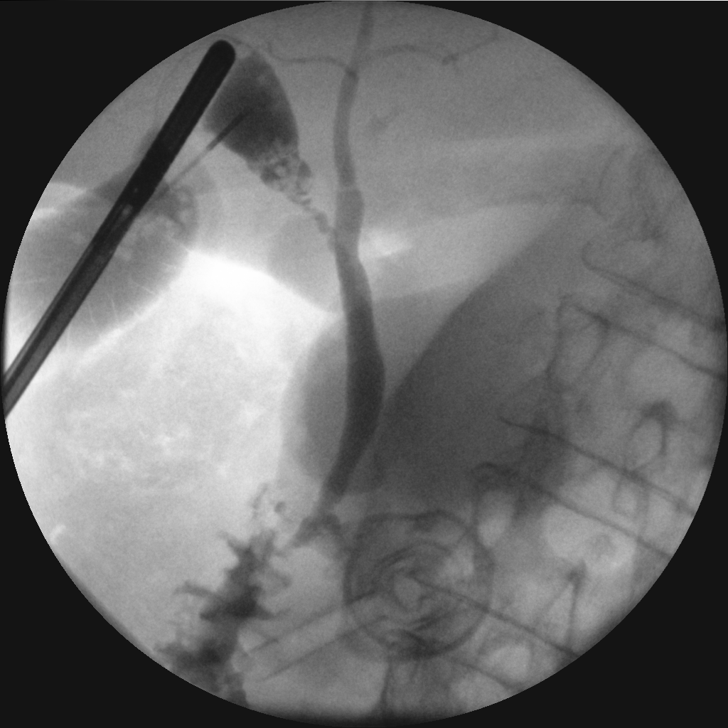

[10 of 10 positions shown; findings below may reference images not displayed]

FINDINGS: Intraoperative cholangiographic images of the right upper abdominal
quadrant during laparoscopic cholecystectomy are provided for
review.

Contrast injection demonstrates selective cannulation of the central
aspect of the cystic duct.

There is passage of contrast through the central aspect of the
cystic duct with filling of a non dilated common bile duct.

There are multiple nonocclusive filling defects within the
gallbladder neck compatible with tiny gallstones.

There is passage of contrast though the CBD and into the descending
portion of the duodenum.

There is minimal reflux of injected contrast into the common hepatic
duct and central aspect of the non dilated intrahepatic biliary
system.

There is minimal opacification of the gallbladder fossa with
multiple persistent filling defects compatible with gallstones.

There are no discrete filling defects within the opacified portions
of the biliary system to suggest the presence of
choledocholithiasis.
IMPRESSION: Cholelithiasis including stones within the neck of the gallbladder.
No definitive persistent filling defects within the common bile duct
to suggest the presence of choledocholithiasis.

## 2018-10-03 ENCOUNTER — Ambulatory Visit: Payer: Medicare Other | Admitting: Internal Medicine

## 2018-10-03 ENCOUNTER — Other Ambulatory Visit: Payer: Self-pay | Admitting: Internal Medicine

## 2018-10-03 ENCOUNTER — Other Ambulatory Visit: Payer: Self-pay

## 2018-10-03 DIAGNOSIS — E039 Hypothyroidism, unspecified: Secondary | ICD-10-CM

## 2018-10-03 DIAGNOSIS — E785 Hyperlipidemia, unspecified: Secondary | ICD-10-CM | POA: Diagnosis not present

## 2018-10-03 DIAGNOSIS — E1122 Type 2 diabetes mellitus with diabetic chronic kidney disease: Secondary | ICD-10-CM

## 2018-10-03 DIAGNOSIS — I10 Essential (primary) hypertension: Secondary | ICD-10-CM | POA: Diagnosis not present

## 2018-10-03 DIAGNOSIS — F324 Major depressive disorder, single episode, in partial remission: Secondary | ICD-10-CM | POA: Diagnosis not present

## 2018-10-03 DIAGNOSIS — G4700 Insomnia, unspecified: Secondary | ICD-10-CM

## 2018-10-03 DIAGNOSIS — M159 Polyosteoarthritis, unspecified: Secondary | ICD-10-CM

## 2018-10-03 DIAGNOSIS — M150 Primary generalized (osteo)arthritis: Secondary | ICD-10-CM

## 2018-10-03 DIAGNOSIS — IMO0002 Reserved for concepts with insufficient information to code with codable children: Secondary | ICD-10-CM

## 2018-10-03 DIAGNOSIS — N181 Chronic kidney disease, stage 1: Secondary | ICD-10-CM

## 2018-10-03 DIAGNOSIS — E1165 Type 2 diabetes mellitus with hyperglycemia: Secondary | ICD-10-CM

## 2018-10-03 DIAGNOSIS — F411 Generalized anxiety disorder: Secondary | ICD-10-CM

## 2018-10-03 MED ORDER — ESOMEPRAZOLE MAGNESIUM 40 MG PO CPDR: 40.0000 mg | DELAYED_RELEASE_CAPSULE | Freq: Every day | ORAL | 3 refills | Status: DC

## 2018-10-03 MED ORDER — BUDESONIDE-FORMOTEROL FUMARATE 160-4.5 MCG/ACT IN AERO: 2.0000 | INHALATION_SPRAY | Freq: Two times a day (BID) | RESPIRATORY_TRACT | 3 refills | Status: DC | PRN

## 2018-10-03 MED ORDER — LEVOTHYROXINE SODIUM 137 MCG PO TABS: 137.0000 ug | ORAL_TABLET | Freq: Every day | ORAL | 3 refills | Status: DC

## 2018-10-03 MED ORDER — ATORVASTATIN CALCIUM 20 MG PO TABS: 20.0000 mg | ORAL_TABLET | Freq: Every day | ORAL | 3 refills | Status: DC

## 2018-10-03 MED ORDER — HYDRALAZINE HCL 100 MG PO TABS: 100.0000 mg | ORAL_TABLET | Freq: Three times a day (TID) | ORAL | 1 refills | Status: DC

## 2018-10-03 MED ORDER — AZITHROMYCIN 250 MG PO TABS: ORAL_TABLET | ORAL | 0 refills | Status: DC

## 2018-10-03 MED ORDER — CANAGLIFLOZIN 300 MG PO TABS: 300.0000 mg | ORAL_TABLET | Freq: Every day | ORAL | 3 refills | Status: DC

## 2018-10-03 MED ORDER — ALPRAZOLAM 0.25 MG PO TABS: 0.2500 mg | ORAL_TABLET | Freq: Two times a day (BID) | ORAL | 1 refills | Status: DC | PRN

## 2018-10-03 MED ORDER — AMLODIPINE BESYLATE 10 MG PO TABS: 10.0000 mg | ORAL_TABLET | Freq: Two times a day (BID) | ORAL | 3 refills | Status: DC

## 2018-10-03 MED ORDER — ESCITALOPRAM OXALATE 10 MG PO TABS: ORAL_TABLET | ORAL | 3 refills | Status: DC

## 2018-10-03 MED ORDER — SAXAGLIPTIN HCL 5 MG PO TABS: 5.0000 mg | ORAL_TABLET | Freq: Every day | ORAL | 2 refills | Status: DC

## 2018-10-03 MED ORDER — TRULANCE 3 MG PO TABS: 3.0000 mg | ORAL_TABLET | Freq: Every day | ORAL | 3 refills | Status: DC

## 2018-10-03 MED ORDER — BYSTOLIC 20 MG PO TABS: ORAL_TABLET | ORAL | 3 refills | Status: DC

## 2018-10-03 MED ORDER — ZOLPIDEM TARTRATE 5 MG PO TABS: 5.0000 mg | ORAL_TABLET | Freq: Every day | ORAL | 1 refills | Status: DC

## 2018-10-09 ENCOUNTER — Encounter: Payer: Self-pay | Admitting: Internal Medicine

## 2018-10-09 NOTE — Progress Notes (Signed)
[facility] [place], [place] [address]  Internal MEDICINE  Office Visit Note  Patient Name: Evelyn Stark  [id number]  [id number]  Date of Service: 10/09/2018  Chief Complaint  Patient presents with  . Medication Management  . Quality Metric Gaps  . Bronchitis  . Depression  . Osteoarthritis  . Coronary Artery Disease  . Diabetes  . Hyperlipidemia  . Hypertension  . Hypothyroidism  . Medical Management of Chronic Issues  . Telephone Assessment    HPI  Pt is connected via telephone( risk of Covid 19 pandemic) to get refills on her medications. She Is c/o being depressed and lack of appetite at this time, she has been under great deal of stress due to family problems, has lost weight as well. C/o not sleeping well at night. Blood sugars have been under good control  Gets sob with minimum exertion however she is not active due to lack of motivation   Current Medication: Outpatient Encounter Medications as of 10/03/2018  Medication Sig  . ALPRAZolam (XANAX) 0.25 MG tablet Take 1 tablet (0.25 mg total) by mouth 2 (two) times daily as needed for sleep or anxiety.  Marland Kitchen amLODipine (NORVASC) 10 MG tablet Take 1 tablet (10 mg total) by mouth 2 (two) times daily.  Marland Kitchen aspirin EC 81 MG tablet Take 81 mg by mouth daily.  Marland Kitchen atorvastatin (LIPITOR) 20 MG tablet Take 1 tablet (20 mg total) by mouth daily.  Marland Kitchen azithromycin (ZITHROMAX) 250 MG tablet Take one tab a day for 10 days for uri  . Blood Glucose Monitoring Suppl (CONTOUR BLOOD GLUCOSE SYSTEM) w/Device KIT Use as directed diag E11.65  . budesonide-formoterol (SYMBICORT) 160-4.5 MCG/ACT inhaler Inhale 2 puffs into the lungs 2 (two) times daily as needed.  . canagliflozin (INVOKANA) 300 MG TABS tablet Take 1 tablet (300 mg total) by mouth daily before breakfast.  . escitalopram (LEXAPRO) 10 MG tablet Take one tab po qd  . esomeprazole (NEXIUM) 40 MG capsule Take 1 capsule (40 mg total) by mouth daily.  Marland Kitchen glimepiride  (AMARYL) 4 MG tablet Take one tab qd  . glucose blood (CONTOUR TEST) test strip 1 each by Other route 3 (three) times daily. diag e11.65  . hydrALAZINE (APRESOLINE) 100 MG tablet Take 1 tablet (100 mg total) by mouth 3 (three) times daily.  . Insulin Glargine (BASAGLAR KWIKPEN) 100 UNIT/ML SOPN Inject 0.5 mLs (50 Units total) into the skin daily.  . insulin glargine (LANTUS) 100 UNIT/ML injection Inject 20 Units into the skin at bedtime.   . Insulin Pen Needle (BD PEN NEEDLE NANO U/F) 32G X 4 MM MISC Use as directed with insulin  . levothyroxine (SYNTHROID) 137 MCG tablet Take 1 tablet (137 mcg total) by mouth daily before breakfast.  . mirtazapine (REMERON) 15 MG tablet Take one 2 to 2 tabs at bed time  . Nebivolol HCl (BYSTOLIC) 20 MG TABS Take one tab po bid  . nystatin (MYCOSTATIN) 100000 UNIT/ML suspension Take 5 mLs (500,000 Units total) by mouth 4 (four) times daily.  . pimecrolimus (ELIDEL) 1 % cream Apply topically 2 (two) times daily.  Marland Kitchen Plecanatide (TRULANCE) 3 MG TABS Take 3 mg by mouth daily.  . saxagliptin HCl (ONGLYZA) 5 MG TABS tablet Take 1 tablet (5 mg total) by mouth daily.  . traZODone (DESYREL) 50 MG tablet Take one 2 tabs at night for sleep  . zolpidem (AMBIEN) 5 MG tablet Take 1 tablet (5 mg total) by mouth at bedtime.   No  facility-administered encounter medications on file as of 10/03/2018.     Surgical History: Past Surgical History:  Procedure Laterality Date  . CHOLECYSTECTOMY N/A 11/20/2015   Procedure: LAPAROSCOPIC CHOLECYSTECTOMY WITH INTRAOPERATIVE CHOLANGIOGRAM;  Surgeon: Christene Lye, MD;  Location: [facility] ORS;  Service: General;  Laterality: N/A;  . COLONOSCOPY  2000  . coronary stents     x 3  . EYE SURGERY    . LEFT HEART CATHETERIZATION WITH CORONARY ANGIOGRAM N/A 11/27/2012   Procedure: LEFT HEART CATHETERIZATION WITH CORONARY ANGIOGRAM;  Surgeon: Clent Demark, MD;  Location: [facility] CATH LAB;  Service: Cardiovascular;  Laterality: N/A;  . MINOR  HEMORRHOIDECTOMY      Medical History: Past Medical History:  Diagnosis Date  . Asthma   . Coronary artery disease   . Depression   . Diabetes mellitus without complication ([place])   . Hyperlipidemia   . Hypertension   . Hypothyroidism     Family History: No family history on file.  Social History   Socioeconomic History  . Marital status: Widowed    Spouse name: Not on file  . Number of children: Not on file  . Years of education: Not on file  . Highest education level: Not on file  Occupational History  . Not on file  Social Needs  . Financial resource strain: Not on file  . Food insecurity    Worry: Not on file    Inability: Not on file  . Transportation needs    Medical: Not on file    Non-medical: Not on file  Tobacco Use  . Smoking status: Never Smoker  . Smokeless tobacco: Never Used  Substance and Sexual Activity  . Alcohol use: No  . Drug use: No  . Sexual activity: Not on file  Lifestyle  . Physical activity    Days per week: Not on file    Minutes per session: Not on file  . Stress: Not on file  Relationships  . Social Herbalist on phone: Not on file    Gets together: Not on file    Attends religious service: Not on file    Active member of club or organization: Not on file    Attends meetings of clubs or organizations: Not on file    Relationship status: Not on file  . Intimate partner violence    Fear of current or ex partner: Not on file    Emotionally abused: Not on file    Physically abused: Not on file    Forced sexual activity: Not on file  Other Topics Concern  . Not on file  Social History Narrative  . Not on file    Review of Systems  Constitutional: Positive for fatigue and unexpected weight change. Negative for chills and diaphoresis.  HENT: Negative for ear pain, postnasal drip and sinus pressure.   Eyes: Negative for photophobia, discharge, redness, itching and visual disturbance.  Respiratory: Positive for cough  and shortness of breath. Negative for wheezing.   Cardiovascular: Positive for palpitations. Negative for chest pain and leg swelling.  Gastrointestinal: Negative for abdominal pain, constipation, diarrhea, nausea and vomiting.  Genitourinary: Negative for dysuria and flank pain.  Musculoskeletal: Positive for arthralgias and back pain. Negative for gait problem and neck pain.  Skin: Negative for color change.  Allergic/Immunologic: Positive for food allergies. Negative for environmental allergies.  Neurological: Positive for headaches. Negative for dizziness.  Hematological: Does not bruise/bleed easily.  Psychiatric/Behavioral: Positive for agitation and dysphoric mood.  Negative for behavioral problems (depression) and hallucinations.    Vital Signs: There were no vitals taken for this visit.   Physical Exam Pt is connected via phone for refills on her medications, NAD, pleasant to talk to   Assessment/Plan: 1. 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 Essential hypertension, benign Controlled with meds  3. Hyperlipidemia, unspecified hyperlipidemia type Stable   4. Depression, major, single episode, in partial remission ([place]) Uncontrolled due to family issues  5. Insomnia, unspecified type Not well controlled   6. Generalized anxiety disorder Continue Xanax prn only, had tried remeron in the past but developed hallucinations   7. Hypothyroidism, unspecified type Synthroid as before   8. Primary osteoarthritis involving multiple joints Prn use of OTC tylenol   General Counseling: Cristel verbalizes understanding of the findings of todays visit and agrees with plan of treatment. I have discussed any further diagnostic evaluation that may be needed or ordered today. We also reviewed her medications today. she has been encouraged to call the office with any questions or concerns that should arise related  to todays visit.   Time spent:25 Minutes      Dr Lavera Guise Internal medicine

## 2018-12-17 ENCOUNTER — Other Ambulatory Visit: Payer: Self-pay

## 2018-12-17 ENCOUNTER — Ambulatory Visit (INDEPENDENT_AMBULATORY_CARE_PROVIDER_SITE_OTHER): Payer: Medicare Other | Admitting: Internal Medicine

## 2018-12-17 DIAGNOSIS — M159 Polyosteoarthritis, unspecified: Secondary | ICD-10-CM

## 2018-12-17 DIAGNOSIS — M8949 Other hypertrophic osteoarthropathy, multiple sites: Secondary | ICD-10-CM

## 2018-12-17 DIAGNOSIS — E1122 Type 2 diabetes mellitus with diabetic chronic kidney disease: Secondary | ICD-10-CM

## 2018-12-17 DIAGNOSIS — F324 Major depressive disorder, single episode, in partial remission: Secondary | ICD-10-CM

## 2018-12-17 DIAGNOSIS — E538 Deficiency of other specified B group vitamins: Secondary | ICD-10-CM

## 2018-12-17 DIAGNOSIS — E1165 Type 2 diabetes mellitus with hyperglycemia: Secondary | ICD-10-CM

## 2018-12-17 DIAGNOSIS — H9193 Unspecified hearing loss, bilateral: Secondary | ICD-10-CM

## 2018-12-17 DIAGNOSIS — E039 Hypothyroidism, unspecified: Secondary | ICD-10-CM

## 2018-12-17 DIAGNOSIS — I2510 Atherosclerotic heart disease of native coronary artery without angina pectoris: Secondary | ICD-10-CM | POA: Diagnosis not present

## 2018-12-17 DIAGNOSIS — I10 Essential (primary) hypertension: Secondary | ICD-10-CM | POA: Diagnosis not present

## 2018-12-17 DIAGNOSIS — E785 Hyperlipidemia, unspecified: Secondary | ICD-10-CM | POA: Diagnosis not present

## 2018-12-17 DIAGNOSIS — Z9861 Coronary angioplasty status: Secondary | ICD-10-CM

## 2018-12-17 DIAGNOSIS — K029 Dental caries, unspecified: Secondary | ICD-10-CM

## 2018-12-17 DIAGNOSIS — IMO0002 Reserved for concepts with insufficient information to code with codable children: Secondary | ICD-10-CM

## 2018-12-17 DIAGNOSIS — M810 Age-related osteoporosis without current pathological fracture: Secondary | ICD-10-CM

## 2018-12-17 DIAGNOSIS — N181 Chronic kidney disease, stage 1: Secondary | ICD-10-CM

## 2018-12-18 MED ORDER — MIRTAZAPINE 7.5 MG PO TABS: ORAL_TABLET | ORAL | 3 refills | Status: DC

## 2018-12-18 MED ORDER — BASAGLAR KWIKPEN 100 UNIT/ML ~~LOC~~ SOPN: PEN_INJECTOR | SUBCUTANEOUS | 3 refills | Status: DC

## 2018-12-18 NOTE — Progress Notes (Addendum)
[facility] [place], [place] [address]  Internal MEDICINE  Office Visit Note  Patient Name: Evelyn Stark  [id number]  [id number]  Date of Service: 12/19/2018  Chief Complaint  Patient presents with  . Coronary Artery Disease  . Diabetes  . Hypertension  . Hypothyroidism  . Osteoarthritis  . Depression    HPI  Pt has multiple medical problems. She was stuck in [place] for last 10 months due to pandemic of Covid-19. C/O back pain and weakness in her legs, difficulty walking, feels off balance CAD, s/p angioplasty, c/o sob off and on Hard of hearing, getting worse She also has lost few teeth and it is hard for her to chew her food, has lost over 20 lbs in the last 10 months She is depressed as well, frequent crying spells  DM status is unknown since she has not been checking her sugars due to broken glucometer  C/o not sleeping well C/o chronic diarrhea  She is not up to date on her preventive HM   Current Medication: Outpatient Encounter Medications as of 12/17/2018  Medication Sig  . ALPRAZolam (XANAX) 0.25 MG tablet Take 1 tablet (0.25 mg total) by mouth 2 (two) times daily as needed for sleep or anxiety.  Marland Kitchen amLODipine (NORVASC) 10 MG tablet Take 1 tablet (10 mg total) by mouth 2 (two) times daily.  Marland Kitchen aspirin EC 81 MG tablet Take 81 mg by mouth daily.  Marland Kitchen atorvastatin (LIPITOR) 20 MG tablet Take 1 tablet (20 mg total) by mouth daily.  Marland Kitchen azithromycin (ZITHROMAX) 250 MG tablet Take one tab a day for 10 days for uri  . Blood Glucose Monitoring Suppl (CONTOUR BLOOD GLUCOSE SYSTEM) w/Device KIT Use as directed diag E11.65  . budesonide-formoterol (SYMBICORT) 160-4.5 MCG/ACT inhaler Inhale 2 puffs into the lungs 2 (two) times daily as needed.  . canagliflozin (INVOKANA) 300 MG TABS tablet Take 1 tablet (300 mg total) by mouth daily before breakfast.  . escitalopram (LEXAPRO) 10 MG tablet Take one tab po qd  . esomeprazole (NEXIUM) 40 MG capsule Take 1  capsule (40 mg total) by mouth daily.  Marland Kitchen glimepiride (AMARYL) 4 MG tablet Take one tab qd  . glucose blood (CONTOUR TEST) test strip 1 each by Other route 3 (three) times daily. diag e11.65  . hydrALAZINE (APRESOLINE) 100 MG tablet Take 1 tablet (100 mg total) by mouth 3 (three) times daily.  . Insulin Glargine (BASAGLAR KWIKPEN) 100 UNIT/ML SOPN Use 20-25 units with supper qd  . Insulin Pen Needle (BD PEN NEEDLE NANO U/F) 32G X 4 MM MISC Use as directed with insulin  . levothyroxine (SYNTHROID) 137 MCG tablet Take 1 tablet (137 mcg total) by mouth daily before breakfast.  . mirtazapine (REMERON) 7.5 MG tablet Take one tab po qhs for insomnia  . Nebivolol HCl (BYSTOLIC) 20 MG TABS Take one tab po bid  . nystatin (MYCOSTATIN) 100000 UNIT/ML suspension Take 5 mLs (500,000 Units total) by mouth 4 (four) times daily.  . pimecrolimus (ELIDEL) 1 % cream Apply topically 2 (two) times daily.  Marland Kitchen Plecanatide (TRULANCE) 3 MG TABS Take 3 mg by mouth daily.  . saxagliptin HCl (ONGLYZA) 5 MG TABS tablet Take 1 tablet (5 mg total) by mouth daily.  . traZODone (DESYREL) 50 MG tablet Take one 2 tabs at night for sleep  . zolpidem (AMBIEN) 5 MG tablet Take 1 tablet (5 mg total) by mouth at bedtime.  . [DISCONTINUED] Insulin Glargine (BASAGLAR KWIKPEN) 100 UNIT/ML SOPN Inject  0.5 mLs (50 Units total) into the skin daily.  . [DISCONTINUED] insulin glargine (LANTUS) 100 UNIT/ML injection Inject 20 Units into the skin at bedtime.   . [DISCONTINUED] mirtazapine (REMERON) 15 MG tablet Take one 2 to 2 tabs at bed time   No facility-administered encounter medications on file as of 12/17/2018.     Surgical History: Past Surgical History:  Procedure Laterality Date  . CHOLECYSTECTOMY N/A 11/20/2015   Procedure: LAPAROSCOPIC CHOLECYSTECTOMY WITH INTRAOPERATIVE CHOLANGIOGRAM;  Surgeon: Christene Lye, MD;  Location: [facility] ORS;  Service: General;  Laterality: N/A;  . COLONOSCOPY  2000  . coronary stents     x 3   . EYE SURGERY    . LEFT HEART CATHETERIZATION WITH CORONARY ANGIOGRAM N/A 11/27/2012   Procedure: LEFT HEART CATHETERIZATION WITH CORONARY ANGIOGRAM;  Surgeon: Clent Demark, MD;  Location: [facility] CATH LAB;  Service: Cardiovascular;  Laterality: N/A;  . MINOR HEMORRHOIDECTOMY      Medical History: Past Medical History:  Diagnosis Date  . Asthma   . Coronary artery disease   . Depression   . Diabetes mellitus without complication ([place])   . Hyperlipidemia   . Hypertension   . Hypothyroidism     Family History: Family History  Family history unknown: Yes    Social History   Socioeconomic History  . Marital status: Widowed    Spouse name: Not on file  . Number of children: Not on file  . Years of education: Not on file  . Highest education level: Not on file  Occupational History  . Not on file  Social Needs  . Financial resource strain: Not on file  . Food insecurity    Worry: Not on file    Inability: Not on file  . Transportation needs    Medical: Not on file    Non-medical: Not on file  Tobacco Use  . Smoking status: Never Smoker  . Smokeless tobacco: Never Used  Substance and Sexual Activity  . Alcohol use: No  . Drug use: No  . Sexual activity: Not on file  Lifestyle  . Physical activity    Days per week: Not on file    Minutes per session: Not on file  . Stress: Not on file  Relationships  . Social Herbalist on phone: Not on file    Gets together: Not on file    Attends religious service: Not on file    Active member of club or organization: Not on file    Attends meetings of clubs or organizations: Not on file    Relationship status: Not on file  . Intimate partner violence    Fear of current or ex partner: Not on file    Emotionally abused: Not on file    Physically abused: Not on file    Forced sexual activity: Not on file  Other Topics Concern  . Not on file  Social History Narrative  . Not on file    Review of Systems   Constitutional: Positive for fatigue and unexpected weight change. Negative for chills and diaphoresis.  HENT: Positive for dental problem, hearing loss, sore throat and voice change. Negative for ear pain, postnasal drip and sinus pressure.   Eyes: Negative for itching and visual disturbance.  Respiratory: Positive for chest tightness and shortness of breath. Negative for wheezing.   Cardiovascular: Positive for palpitations. Negative for chest pain and leg swelling.  Gastrointestinal: Positive for diarrhea. Negative for abdominal pain, constipation, nausea and  vomiting.  Genitourinary: Negative for dysuria and flank pain.  Musculoskeletal: Positive for arthralgias, back pain, gait problem and neck pain.  Skin: Negative for color change.  Allergic/Immunologic: Positive for environmental allergies. Negative for food allergies.  Neurological: Positive for weakness, numbness and headaches. Negative for dizziness.  Hematological: Does not bruise/bleed easily.  Psychiatric/Behavioral: Positive for agitation, dysphoric mood and sleep disturbance. Negative for behavioral problems (depression) and hallucinations.    Vital Signs: BP 136/89   Pulse 85   Temp 98.9 F (37.2 C)   Resp 14   Ht 5' 3"  (1.6 m)   Wt 155 lb (70.3 kg)   SpO2 96%   BMI 27.46 kg/m    Physical Exam Constitutional:      General: She is not in acute distress.    Appearance: She is well-developed. She is not diaphoretic.     Comments: Looks depressed   HENT:     Head: Normocephalic and atraumatic.     Mouth/Throat:     Mouth: Mucous membranes are moist.     Dentition: Abnormal dentition. Gingival swelling and dental caries present.     Pharynx: Oropharynx is clear. No oropharyngeal exudate.     Tonsils: No tonsillar exudate.  Eyes:     Pupils: Pupils are equal, round, and reactive to light.  Neck:     Musculoskeletal: Normal range of motion and neck supple.     Thyroid: No thyromegaly.     Vascular: No JVD.      Trachea: No tracheal deviation.  Cardiovascular:     Rate and Rhythm: Normal rate and regular rhythm.     Heart sounds: Normal heart sounds. No murmur. No friction rub. No gallop.   Pulmonary:     Effort: Pulmonary effort is normal. No respiratory distress.     Breath sounds: No wheezing or rales.  Chest:     Chest wall: No tenderness.  Abdominal:     General: Bowel sounds are normal.     Palpations: Abdomen is soft.  Musculoskeletal:        General: Deformity present.     Comments: Poor posture, cannot stand straight   Lymphadenopathy:     Cervical: No cervical adenopathy.  Skin:    General: Skin is warm and dry.  Neurological:     Mental Status: She is alert and oriented to person, place, and time.     Cranial Nerves: No cranial nerve deficit.     Motor: Weakness present.     Coordination: Coordination abnormal.     Gait: Gait abnormal.     Deep Tendon Reflexes: Reflexes normal.     Comments: Muscle wasting present   Psychiatric:        Behavior: Behavior normal.        Thought Content: Thought content normal.        Judgment: Judgment normal.    Assessment/Plan: 1. Coronary arteriosclerosis after percutaneous transluminal coronary angioplasty (PTCA) Pt will need follow up with cardiology, continue all meds as brfore, continue ASA, She is off of Plavix  - CBC with Differential/Platelet - Ferritin; Future - For home use only DME 4 wheeled rolling walker with seat ([id number]) - Ambulatory referral to Physical Therapy  2. 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 Hyperlipidemia, unspecified hyperlipidemia type Continue Lipitor as before  - Lipid Panel With LDL/HDL Ratio - Comprehensive metabolic panel  4. Essential  hypertension, benign Pt is on Hydralazine, norvasc and Bystolic, monitor kidney functions  - TSH - T4, free - Comprehensive  metabolic panel  5. Hypothyroidism, unspecified type Continue Synthroid   6. Primary osteoarthritis involving multiple joints Poor posture, will need PT and gait training, fall precaution - ANA w/Reflex if Positive; Future - For home use only DME 4 wheeled rolling walker with seat ([id number]) - Ambulatory referral to Physical Therapy  7. Decreased hearing of both ears Pt will need hearing aide  - Ambulatory referral to ENT  8. B12 deficiency Pt was on supplements, check levels  - CBC with Differential/Platelet - B12 and Folate Panel  9. Senile osteoporosis Evaluate her status for osteoporosis  - For home use only DME 4 wheeled rolling walker with seat ([id number]) - DG Bone Density; Future  10. Dental caries She will need to see dentist and an oral surgeon, has lost weight due to unable to chew her food properly   11. Depression, major, single episode, in partial remission (HCC) Continue Lexapro and all Remeron to help with insomnia  - mirtazapine (REMERON) 7.5 MG tablet; Take one tab po qhs for insomnia  Dispense: 90 tablet; Refill: 3  General Counseling: Trudie verbalizes understanding of the findings of todays visit and agrees with plan of treatment. I have discussed any further diagnostic evaluation that may be needed or ordered today. We also reviewed her medications today. she has been encouraged to call the office with any questions or concerns that should arise related to todays visit.     Durable Medical Equipment  (From admission, onward)         Start     Ordered   12/18/18 0000  For home use only DME 4 wheeled rolling walker with seat ([id number])    Question:  Patient needs a walker to treat with the following condition  Answer:  Poor tolerance for ambulation   12/18/18 0937        face to face encounter for rolling walker with seat. Pt needs assistance with walking, to prevent risk of fall, be able to do do her ADL's   Orders Placed This Encounter   Procedures  . For home use only DME 4 wheeled rolling walker with seat ([id number])  . DG Bone Density  . CBC with Differential/Platelet  . Lipid Panel With LDL/HDL Ratio  . TSH  . T4, free  . Comprehensive metabolic panel  . Urinalysis  . ANA w/Reflex if Positive  . B12 and Folate Panel  . Ferritin  . Microalbumin, urine  . Ambulatory referral to ENT  . Ambulatory referral to Physical Therapy    Meds ordered this encounter  Medications  . mirtazapine (REMERON) 7.5 MG tablet    Sig: Take one tab po qhs for insomnia    Dispense:  90 tablet    Refill:  3  . Insulin Glargine (BASAGLAR KWIKPEN) 100 UNIT/ML SOPN    Sig: Use 20-25 units with supper qd    Dispense:  3 pen    Refill:  3    Time spent: 25 Minutes      Dr Lavera Guise Internal medicine

## 2018-12-19 ENCOUNTER — Encounter: Payer: Self-pay | Admitting: Internal Medicine

## 2018-12-20 ENCOUNTER — Telehealth: Payer: Self-pay

## 2018-12-20 NOTE — Telephone Encounter (Signed)
FAXED ORDER  FOR ROLLING WALKER WITH SEAT TO [facility]

## 2018-12-24 ENCOUNTER — Other Ambulatory Visit: Payer: Self-pay

## 2018-12-24 MED ORDER — TRAMADOL HCL 50 MG PO TABS: 50.0000 mg | ORAL_TABLET | Freq: Two times a day (BID) | ORAL | 1 refills | Status: DC

## 2018-12-24 NOTE — Telephone Encounter (Signed)
Called in tramadol 50 mg 1 tab po bid 180 with 1 refills as per dfk

## 2018-12-25 ENCOUNTER — Other Ambulatory Visit: Payer: Self-pay

## 2018-12-25 MED ORDER — CONTOUR NEXT MONITOR W/DEVICE KIT: 1.0000 | PACK | Freq: Every day | 0 refills | Status: AC

## 2018-12-25 MED ORDER — GLIMEPIRIDE 2 MG PO TABS: 2.0000 mg | ORAL_TABLET | Freq: Two times a day (BID) | ORAL | 1 refills | Status: DC

## 2018-12-25 MED ORDER — ROPINIROLE HCL 0.25 MG PO TABS: 0.2500 mg | ORAL_TABLET | Freq: Two times a day (BID) | ORAL | 1 refills | Status: DC

## 2018-12-25 MED ORDER — GABAPENTIN 100 MG PO CAPS: 100.0000 mg | ORAL_CAPSULE | Freq: Three times a day (TID) | ORAL | 1 refills | Status: DC

## 2018-12-26 ENCOUNTER — Other Ambulatory Visit: Payer: Self-pay

## 2018-12-26 MED ORDER — CONTOUR NEXT TEST VI STRP: ORAL_STRIP | 3 refills | Status: DC

## 2019-01-01 ENCOUNTER — Other Ambulatory Visit: Payer: Self-pay

## 2019-01-01 DIAGNOSIS — E1165 Type 2 diabetes mellitus with hyperglycemia: Secondary | ICD-10-CM

## 2019-01-01 DIAGNOSIS — E039 Hypothyroidism, unspecified: Secondary | ICD-10-CM

## 2019-01-01 DIAGNOSIS — I2510 Atherosclerotic heart disease of native coronary artery without angina pectoris: Secondary | ICD-10-CM

## 2019-01-01 DIAGNOSIS — M159 Polyosteoarthritis, unspecified: Secondary | ICD-10-CM

## 2019-01-01 MED ORDER — BD PEN NEEDLE NANO U/F 32G X 4 MM MISC: 5 refills | Status: DC

## 2019-01-02 ENCOUNTER — Telehealth: Payer: Self-pay | Admitting: Internal Medicine

## 2019-01-02 LAB — COMPREHENSIVE METABOLIC PANEL
ALT: 10 IU/L (ref 0–32)
AST: 18 IU/L (ref 0–40)
Albumin/Globulin Ratio: 1.1 — ABNORMAL LOW (ref 1.2–2.2)
Albumin: 3.5 g/dL — ABNORMAL LOW (ref 3.7–4.7)
Alkaline Phosphatase: 81 IU/L (ref 39–117)
BUN/Creatinine Ratio: 14 (ref 12–28)
BUN: 17 mg/dL (ref 8–27)
Bilirubin Total: 0.5 mg/dL (ref 0.0–1.2)
CO2: 18 mmol/L — ABNORMAL LOW (ref 20–29)
Calcium: 9 mg/dL (ref 8.7–10.3)
Chloride: 102 mmol/L (ref 96–106)
Creatinine, Ser: 1.22 mg/dL — ABNORMAL HIGH (ref 0.57–1.00)
GFR calc Af Amer: 48 mL/min/{1.73_m2} — ABNORMAL LOW (ref >59)
GFR calc non Af Amer: 42 mL/min/{1.73_m2} — ABNORMAL LOW (ref >59)
Globulin, Total: 3.3 g/dL (ref 1.5–4.5)
Glucose: 318 mg/dL — ABNORMAL HIGH (ref 65–99)
Potassium: 5 mmol/L (ref 3.5–5.2)
Sodium: 133 mmol/L — ABNORMAL LOW (ref 134–144)
Total Protein: 6.8 g/dL (ref 6.0–8.5)

## 2019-01-02 LAB — ANA W/REFLEX IF POSITIVE
Anti JO-1: <0.2 AI (ref 0.0–0.9)
Anti Nuclear Antibody (ANA): POSITIVE — AB
Centromere Ab Screen: 2.6 AI — ABNORMAL HIGH (ref 0.0–0.9)
Chromatin Ab SerPl-aCnc: 0.3 AI (ref 0.0–0.9)
ENA RNP Ab: 0.5 AI (ref 0.0–0.9)
ENA SM Ab Ser-aCnc: <0.2 AI (ref 0.0–0.9)
ENA SSA (RO) Ab: <0.2 AI (ref 0.0–0.9)
ENA SSB (LA) Ab: <0.2 AI (ref 0.0–0.9)
Scleroderma (Scl-70) (ENA) Antibody, IgG: <0.2 AI (ref 0.0–0.9)
dsDNA Ab: 12 IU/mL — ABNORMAL HIGH (ref 0–9)

## 2019-01-02 LAB — HGB A1C W/O EAG: Hgb A1c MFr Bld: 7.8 % — ABNORMAL HIGH (ref 4.8–5.6)

## 2019-01-02 LAB — CBC WITH DIFFERENTIAL/PLATELET
Basophils Absolute: 0.1 10*3/uL (ref 0.0–0.2)
Basos: 1 %
EOS (ABSOLUTE): 0.4 10*3/uL (ref 0.0–0.4)
Eos: 6 %
Hematocrit: 39.5 % (ref 34.0–46.6)
Hemoglobin: 12.6 g/dL (ref 11.1–15.9)
Immature Grans (Abs): 0 10*3/uL (ref 0.0–0.1)
Immature Granulocytes: 0 %
Lymphocytes Absolute: 2.5 10*3/uL (ref 0.7–3.1)
Lymphs: 31 %
MCH: 27 pg (ref 26.6–33.0)
MCHC: 31.9 g/dL (ref 31.5–35.7)
MCV: 85 fL (ref 79–97)
Monocytes Absolute: 0.6 10*3/uL (ref 0.1–0.9)
Monocytes: 7 %
Neutrophils Absolute: 4.3 10*3/uL (ref 1.4–7.0)
Neutrophils: 55 %
Platelets: 320 10*3/uL (ref 150–450)
RBC: 4.66 x10E6/uL (ref 3.77–5.28)
RDW: 13.8 % (ref 11.7–15.4)
WBC: 7.9 10*3/uL (ref 3.4–10.8)

## 2019-01-02 LAB — LIPID PANEL WITH LDL/HDL RATIO
Cholesterol, Total: 175 mg/dL (ref 100–199)
HDL: 32 mg/dL — ABNORMAL LOW (ref >39)
LDL Chol Calc (NIH): 107 mg/dL — ABNORMAL HIGH (ref 0–99)
LDL/HDL Ratio: 3.3 ratio — ABNORMAL HIGH (ref 0.0–3.2)
Triglycerides: 205 mg/dL — ABNORMAL HIGH (ref 0–149)
VLDL Cholesterol Cal: 36 mg/dL (ref 5–40)

## 2019-01-02 LAB — T4, FREE: Free T4: 1.54 ng/dL (ref 0.82–1.77)

## 2019-01-02 LAB — FERRITIN: Ferritin: 33 ng/mL (ref 15–150)

## 2019-01-02 LAB — TSH: TSH: 2.78 u[IU]/mL (ref 0.450–4.500)

## 2019-01-02 LAB — B12 AND FOLATE PANEL
Folate: 18.8 ng/mL (ref >3.0)
Vitamin B-12: 772 pg/mL (ref 232–1245)

## 2019-01-03 NOTE — Telephone Encounter (Signed)
Ok thank you. Evelyn Stark

## 2019-01-22 ENCOUNTER — Ambulatory Visit (INDEPENDENT_AMBULATORY_CARE_PROVIDER_SITE_OTHER): Payer: Medicare Other

## 2019-01-22 ENCOUNTER — Other Ambulatory Visit: Payer: Self-pay

## 2019-01-22 DIAGNOSIS — Z23 Encounter for immunization: Secondary | ICD-10-CM

## 2019-01-23 ENCOUNTER — Ambulatory Visit (INDEPENDENT_AMBULATORY_CARE_PROVIDER_SITE_OTHER): Payer: Medicare Other | Admitting: Internal Medicine

## 2019-01-23 DIAGNOSIS — R0902 Hypoxemia: Secondary | ICD-10-CM

## 2019-01-23 DIAGNOSIS — F411 Generalized anxiety disorder: Secondary | ICD-10-CM

## 2019-01-23 DIAGNOSIS — F324 Major depressive disorder, single episode, in partial remission: Secondary | ICD-10-CM | POA: Diagnosis not present

## 2019-01-23 DIAGNOSIS — R0600 Dyspnea, unspecified: Secondary | ICD-10-CM

## 2019-01-23 DIAGNOSIS — R0689 Other abnormalities of breathing: Secondary | ICD-10-CM

## 2019-01-23 DIAGNOSIS — Z9862 Peripheral vascular angioplasty status: Secondary | ICD-10-CM

## 2019-01-23 DIAGNOSIS — M48061 Spinal stenosis, lumbar region without neurogenic claudication: Secondary | ICD-10-CM

## 2019-01-28 ENCOUNTER — Ambulatory Visit (INDEPENDENT_AMBULATORY_CARE_PROVIDER_SITE_OTHER): Payer: Medicare Other

## 2019-01-28 ENCOUNTER — Other Ambulatory Visit: Payer: Self-pay

## 2019-01-28 ENCOUNTER — Encounter: Payer: Self-pay | Admitting: Internal Medicine

## 2019-01-28 DIAGNOSIS — E538 Deficiency of other specified B group vitamins: Secondary | ICD-10-CM

## 2019-01-28 MED ORDER — ALPRAZOLAM 0.25 MG PO TABS: 0.2500 mg | ORAL_TABLET | Freq: Two times a day (BID) | ORAL | 1 refills | Status: DC | PRN

## 2019-01-28 MED ORDER — ESOMEPRAZOLE MAGNESIUM 40 MG PO CPDR: 40.0000 mg | DELAYED_RELEASE_CAPSULE | Freq: Every day | ORAL | 3 refills | Status: DC

## 2019-01-28 MED ORDER — CYANOCOBALAMIN 1000 MCG/ML IJ SOLN
1000.0000 ug | Freq: Once | INTRAMUSCULAR | Status: AC
  Administered 2019-01-28: 1000 ug via INTRAMUSCULAR

## 2019-01-28 MED ORDER — ATORVASTATIN CALCIUM 40 MG PO TABS: 20.0000 mg | ORAL_TABLET | Freq: Every day | ORAL | 3 refills | Status: DC

## 2019-01-28 MED ORDER — ESCITALOPRAM OXALATE 20 MG PO TABS: ORAL_TABLET | ORAL | 3 refills | Status: DC

## 2019-01-28 NOTE — Progress Notes (Signed)
[facility] [place], [place] [address]  Internal MEDICINE  Office Visit Note  Patient Name: Evelyn Stark  [id number]  [id number]  Date of Service: 01/28/2019  Chief Complaint  Patient presents with  . Shortness of Breath  . Back Pain  . Depression  . Diabetes Mellitus  . b12 deficincy    HPI She continues to have worsening sob, with exertion she drops her o2 sats to low 80's ( she has pulse ox at home) seeing  Chiropractor which has helped her with back pain, Blood pressure is slightly elevated. She has not seen her cardiologist for last 5 years. Feels depressed, unable to sleep at night. No fever or chills, no sputum production, cough and wheezing at times. Walks minimally from bed to the bathroom, legs get weak and sob gets worse   Current Medication: Outpatient Encounter Medications as of 01/23/2019  Medication Sig  . ALPRAZolam (XANAX) 0.25 MG tablet Take 1 tablet (0.25 mg total) by mouth 2 (two) times daily as needed for sleep or anxiety.  Marland Kitchen amLODipine (NORVASC) 10 MG tablet Take 1 tablet (10 mg total) by mouth 2 (two) times daily.  Marland Kitchen aspirin EC 81 MG tablet Take 81 mg by mouth daily.  Marland Kitchen atorvastatin (LIPITOR) 20 MG tablet Take 1 tablet (20 mg total) by mouth daily.  Marland Kitchen azithromycin (ZITHROMAX) 250 MG tablet Take one tab a day for 10 days for uri  . Blood Glucose Monitoring Suppl (CONTOUR BLOOD GLUCOSE SYSTEM) w/Device KIT Use as directed diag E11.65  . Blood Glucose Monitoring Suppl (CONTOUR NEXT MONITOR) w/Device KIT 1 each by Does not apply route daily. Use as directed Diag E11.65  . budesonide-formoterol (SYMBICORT) 160-4.5 MCG/ACT inhaler Inhale 2 puffs into the lungs 2 (two) times daily as needed.  . canagliflozin (INVOKANA) 300 MG TABS tablet Take 1 tablet (300 mg total) by mouth daily before breakfast.  . escitalopram (LEXAPRO) 20 MG tablet Take one tab po qd for depression  . esomeprazole (NEXIUM) 40 MG capsule Take 1 capsule (40 mg total) by  mouth daily.  Marland Kitchen gabapentin (NEURONTIN) 100 MG capsule Take 1 capsule (100 mg total) by mouth 3 (three) times daily.  Marland Kitchen glimepiride (AMARYL) 2 MG tablet Take 1 tablet (2 mg total) by mouth 2 (two) times daily.  Marland Kitchen glucose blood (CONTOUR NEXT TEST) test strip Use as instructed three times a daily diag E11.65  . glucose blood (CONTOUR TEST) test strip 1 each by Other route 3 (three) times daily. diag e11.65  . hydrALAZINE (APRESOLINE) 100 MG tablet Take 1 tablet (100 mg total) by mouth 3 (three) times daily.  . Insulin Glargine (BASAGLAR KWIKPEN) 100 UNIT/ML SOPN Use 20-25 units with supper qd  . Insulin Pen Needle (BD PEN NEEDLE NANO U/F) 32G X 4 MM MISC Use as directed with insulin  . levothyroxine (SYNTHROID) 137 MCG tablet Take 1 tablet (137 mcg total) by mouth daily before breakfast.  . mirtazapine (REMERON) 7.5 MG tablet Take one tab po qhs for insomnia  . Nebivolol HCl (BYSTOLIC) 20 MG TABS Take one tab po bid  . nystatin (MYCOSTATIN) 100000 UNIT/ML suspension Take 5 mLs (500,000 Units total) by mouth 4 (four) times daily.  . pimecrolimus (ELIDEL) 1 % cream Apply topically 2 (two) times daily.  Marland Kitchen Plecanatide (TRULANCE) 3 MG TABS Take 3 mg by mouth daily.  Marland Kitchen rOPINIRole (REQUIP) 0.25 MG tablet Take 1 tablet (0.25 mg total) by mouth 2 (two) times daily.  . saxagliptin HCl (ONGLYZA) 5  MG TABS tablet Take 1 tablet (5 mg total) by mouth daily.  . traMADol (ULTRAM) 50 MG tablet Take 1 tablet (50 mg total) by mouth 2 (two) times daily.  . traZODone (DESYREL) 50 MG tablet Take one 2 tabs at night for sleep  . zolpidem (AMBIEN) 5 MG tablet Take 1 tablet (5 mg total) by mouth at bedtime.  . [DISCONTINUED] ALPRAZolam (XANAX) 0.25 MG tablet Take 1 tablet (0.25 mg total) by mouth 2 (two) times daily as needed for sleep or anxiety.  . [DISCONTINUED] escitalopram (LEXAPRO) 10 MG tablet Take one tab po qd   No facility-administered encounter medications on file as of 01/23/2019.     Surgical  History: Past Surgical History:  Procedure Laterality Date  . CHOLECYSTECTOMY N/A 11/20/2015   Procedure: LAPAROSCOPIC CHOLECYSTECTOMY WITH INTRAOPERATIVE CHOLANGIOGRAM;  Surgeon: Christene Lye, MD;  Location: [facility] ORS;  Service: General;  Laterality: N/A;  . COLONOSCOPY  2000  . coronary stents     x 3  . EYE SURGERY    . LEFT HEART CATHETERIZATION WITH CORONARY ANGIOGRAM N/A 11/27/2012   Procedure: LEFT HEART CATHETERIZATION WITH CORONARY ANGIOGRAM;  Surgeon: Clent Demark, MD;  Location: [facility] CATH LAB;  Service: Cardiovascular;  Laterality: N/A;  . MINOR HEMORRHOIDECTOMY      Medical History: Past Medical History:  Diagnosis Date  . Asthma   . Coronary artery disease   . Depression   . Diabetes mellitus without complication ([place])   . Hyperlipidemia   . Hypertension   . Hypothyroidism     Family History: Family History  Family history unknown: Yes    Social History   Socioeconomic History  . Marital status: Widowed    Spouse name: Not on file  . Number of children: Not on file  . Years of education: Not on file  . Highest education level: Not on file  Occupational History  . Not on file  Social Needs  . Financial resource strain: Not on file  . Food insecurity    Worry: Not on file    Inability: Not on file  . Transportation needs    Medical: Not on file    Non-medical: Not on file  Tobacco Use  . Smoking status: Never Smoker  . Smokeless tobacco: Never Used  Substance and Sexual Activity  . Alcohol use: No  . Drug use: No  . Sexual activity: Not on file  Lifestyle  . Physical activity    Days per week: Not on file    Minutes per session: Not on file  . Stress: Not on file  Relationships  . Social Herbalist on phone: Not on file    Gets together: Not on file    Attends religious service: Not on file    Active member of club or organization: Not on file    Attends meetings of clubs or organizations: Not on file    Relationship  status: Not on file  . Intimate partner violence    Fear of current or ex partner: Not on file    Emotionally abused: Not on file    Physically abused: Not on file    Forced sexual activity: Not on file  Other Topics Concern  . Not on file  Social History Narrative  . Not on file      Review of Systems  Constitutional: Negative for chills, diaphoresis and fatigue.  HENT: Negative for ear pain, postnasal drip and sinus pressure.   Eyes: Negative  for photophobia, discharge, redness, itching and visual disturbance.  Respiratory: Positive for choking, chest tightness and shortness of breath. Negative for cough and wheezing.   Cardiovascular: Negative for chest pain, palpitations and leg swelling.  Gastrointestinal: Negative for abdominal pain, constipation, diarrhea, nausea and vomiting.  Genitourinary: Negative for dysuria and flank pain.  Musculoskeletal: Positive for arthralgias, back pain and gait problem. Negative for neck pain.  Skin: Negative for color change.  Allergic/Immunologic: Negative for environmental allergies and food allergies.  Neurological: Negative for dizziness and headaches.  Hematological: Does not bruise/bleed easily.  Psychiatric/Behavioral: Positive for dysphoric mood and sleep disturbance. Negative for agitation, behavioral problems (depression) and hallucinations. The patient is nervous/anxious.     Vital Signs: BP (!) 150/70 (BP Location: Left Arm)   Pulse 68   Temp 99.6 F (37.6 C) (Oral)   Resp 20   Ht '5\' 3"'$  (1.6 m)   Wt 151 lb (68.5 kg)   SpO2 (!) 88%   BMI 26.75 kg/m    Physical Exam Constitutional:      General: She is not in acute distress.    Appearance: She is well-developed. She is not diaphoretic.  HENT:     Head: Normocephalic and atraumatic.     Mouth/Throat:     Pharynx: No oropharyngeal exudate.  Eyes:     Pupils: Pupils are equal, round, and reactive to light.  Neck:     Musculoskeletal: Normal range of motion and neck  supple.     Thyroid: No thyromegaly.     Vascular: No JVD.     Trachea: No tracheal deviation.  Cardiovascular:     Rate and Rhythm: Normal rate and regular rhythm.     Heart sounds: Normal heart sounds. No murmur. No friction rub. No gallop.   Pulmonary:     Effort: Tachypnea present. No respiratory distress.     Breath sounds: Examination of the right-lower field reveals rales. Examination of the left-lower field reveals rales. Decreased breath sounds and rales present. No wheezing.     Comments: Pt sitting O2 saturations are 88%, with standing and minimum walking there is a drop to 82 %   Chest:     Chest wall: No tenderness.  Abdominal:     General: Bowel sounds are normal.     Palpations: Abdomen is soft.  Musculoskeletal:     Comments: Back .. spine curvature is abnormal Gait abnormal  She feels exhausted,   Lymphadenopathy:     Cervical: No cervical adenopathy.  Skin:    General: Skin is warm and dry.  Neurological:     Mental Status: She is alert and oriented to person, place, and time.     Cranial Nerves: No cranial nerve deficit.  Psychiatric:        Behavior: Behavior normal.        Thought Content: Thought content normal.        Judgment: Judgment normal.    Assessment/Plan: 1. Dyspnea and respiratory abnormalities - Worsening, hypoxic, will need further work up. CT chest in the past was abnormal  - ECHOCARDIOGRAM COMPLETE; Future - For home use only DME oxygen  2. Hypoxia - She needs home O2 for oxygen saturations below 84%, will need a light weight shoulder bag as well  - For home use only DME oxygen  3. Spinal stenosis at L4-L5 level - Continue gabapentin and tramadol for now, will hold work up due to SOB  - MR Lumbar Spine Wo Contrast; Future  4. Depression, major,  single episode, in partial remission (HCC) - Increase Lexapro to 20 mg po qd, continue Ambien and Xanax prn, she does not like taking them due to dizziness   5. Generalized anxiety  disorder - Xanax prn only   6. S/P angioplasties - Control BP, DM, pt is on a statin and ASA. Will need follow up with cardiology   General Counseling: Raushanah verbalizes understanding of the findings of todays visit and agrees with plan of treatment. I have discussed any further diagnostic evaluation that may be needed or ordered today. We also reviewed her medications today. she has been encouraged to call the office with any questions or concerns that should arise related to todays visit.    Orders Placed This Encounter  Procedures  . For home use only DME oxygen  . MR Lumbar Spine Wo Contrast  . ECHOCARDIOGRAM COMPLETE    Meds ordered this encounter  Medications  . ALPRAZolam (XANAX) 0.25 MG tablet    Sig: Take 1 tablet (0.25 mg total) by mouth 2 (two) times daily as needed for sleep or anxiety.    Dispense:  120 tablet    Refill:  1  . escitalopram (LEXAPRO) 20 MG tablet    Sig: Take one tab po qd for depression    Dispense:  90 tablet    Refill:  3    Increase dose due to uncontrolled depression    Time spent 25 Minutes  Dr Lavera Guise Internal medicine

## 2019-01-29 ENCOUNTER — Other Ambulatory Visit: Payer: Self-pay | Admitting: Adult Health

## 2019-01-29 MED ORDER — OXYCODONE HCL 5 MG PO TABS: 5.0000 mg | ORAL_TABLET | ORAL | 0 refills | Status: DC | PRN

## 2019-01-29 NOTE — Progress Notes (Signed)
Sent Oxycodone RX for patient.

## 2019-01-30 ENCOUNTER — Inpatient Hospital Stay: Payer: Medicare Other

## 2019-01-30 ENCOUNTER — Inpatient Hospital Stay
Admission: EM | Admit: 2019-01-30 | Discharge: 2019-02-01 | DRG: 291 | Disposition: A | Discharge status: Home / Self Care | Payer: Medicare Other | Attending: Internal Medicine | Admitting: Internal Medicine

## 2019-01-30 ENCOUNTER — Other Ambulatory Visit: Payer: Self-pay

## 2019-01-30 ENCOUNTER — Emergency Department: Payer: Medicare Other

## 2019-01-30 DIAGNOSIS — I130 Hypertensive heart and chronic kidney disease with heart failure and stage 1 through stage 4 chronic kidney disease, or unspecified chronic kidney disease: Principal | ICD-10-CM | POA: Diagnosis present

## 2019-01-30 DIAGNOSIS — J918 Pleural effusion in other conditions classified elsewhere: Secondary | ICD-10-CM | POA: Diagnosis present

## 2019-01-30 DIAGNOSIS — I2510 Atherosclerotic heart disease of native coronary artery without angina pectoris: Secondary | ICD-10-CM | POA: Diagnosis present

## 2019-01-30 DIAGNOSIS — I5031 Acute diastolic (congestive) heart failure: Secondary | ICD-10-CM | POA: Diagnosis present

## 2019-01-30 DIAGNOSIS — J440 Chronic obstructive pulmonary disease with acute lower respiratory infection: Secondary | ICD-10-CM | POA: Diagnosis present

## 2019-01-30 DIAGNOSIS — Z7982 Long term (current) use of aspirin: Secondary | ICD-10-CM | POA: Diagnosis not present

## 2019-01-30 DIAGNOSIS — Z9049 Acquired absence of other specified parts of digestive tract: Secondary | ICD-10-CM | POA: Diagnosis not present

## 2019-01-30 DIAGNOSIS — J45909 Unspecified asthma, uncomplicated: Secondary | ICD-10-CM | POA: Diagnosis present

## 2019-01-30 DIAGNOSIS — I10 Essential (primary) hypertension: Secondary | ICD-10-CM | POA: Diagnosis not present

## 2019-01-30 DIAGNOSIS — F329 Major depressive disorder, single episode, unspecified: Secondary | ICD-10-CM | POA: Diagnosis present

## 2019-01-30 DIAGNOSIS — E785 Hyperlipidemia, unspecified: Secondary | ICD-10-CM | POA: Diagnosis present

## 2019-01-30 DIAGNOSIS — Z79890 Hormone replacement therapy (postmenopausal): Secondary | ICD-10-CM | POA: Diagnosis not present

## 2019-01-30 DIAGNOSIS — Z955 Presence of coronary angioplasty implant and graft: Secondary | ICD-10-CM | POA: Diagnosis not present

## 2019-01-30 DIAGNOSIS — J4520 Mild intermittent asthma, uncomplicated: Secondary | ICD-10-CM

## 2019-01-30 DIAGNOSIS — N183 Chronic kidney disease, stage 3 unspecified: Secondary | ICD-10-CM | POA: Diagnosis present

## 2019-01-30 DIAGNOSIS — E039 Hypothyroidism, unspecified: Secondary | ICD-10-CM | POA: Diagnosis present

## 2019-01-30 DIAGNOSIS — E8770 Fluid overload, unspecified: Secondary | ICD-10-CM | POA: Diagnosis not present

## 2019-01-30 DIAGNOSIS — Z9889 Other specified postprocedural states: Secondary | ICD-10-CM

## 2019-01-30 DIAGNOSIS — I509 Heart failure, unspecified: Secondary | ICD-10-CM

## 2019-01-30 DIAGNOSIS — E1122 Type 2 diabetes mellitus with diabetic chronic kidney disease: Secondary | ICD-10-CM | POA: Diagnosis present

## 2019-01-30 DIAGNOSIS — I25118 Atherosclerotic heart disease of native coronary artery with other forms of angina pectoris: Secondary | ICD-10-CM | POA: Diagnosis not present

## 2019-01-30 DIAGNOSIS — R0602 Shortness of breath: Secondary | ICD-10-CM

## 2019-01-30 DIAGNOSIS — R160 Hepatomegaly, not elsewhere classified: Secondary | ICD-10-CM | POA: Diagnosis not present

## 2019-01-30 DIAGNOSIS — I361 Nonrheumatic tricuspid (valve) insufficiency: Secondary | ICD-10-CM | POA: Diagnosis not present

## 2019-01-30 DIAGNOSIS — Z794 Long term (current) use of insulin: Secondary | ICD-10-CM | POA: Diagnosis not present

## 2019-01-30 DIAGNOSIS — Z66 Do not resuscitate: Secondary | ICD-10-CM | POA: Diagnosis present

## 2019-01-30 DIAGNOSIS — J189 Pneumonia, unspecified organism: Secondary | ICD-10-CM | POA: Diagnosis present

## 2019-01-30 DIAGNOSIS — E538 Deficiency of other specified B group vitamins: Secondary | ICD-10-CM | POA: Diagnosis present

## 2019-01-30 DIAGNOSIS — I252 Old myocardial infarction: Secondary | ICD-10-CM

## 2019-01-30 DIAGNOSIS — I340 Nonrheumatic mitral (valve) insufficiency: Secondary | ICD-10-CM | POA: Diagnosis not present

## 2019-01-30 DIAGNOSIS — Z20828 Contact with and (suspected) exposure to other viral communicable diseases: Secondary | ICD-10-CM | POA: Diagnosis present

## 2019-01-30 DIAGNOSIS — R0902 Hypoxemia: Secondary | ICD-10-CM | POA: Diagnosis present

## 2019-01-30 DIAGNOSIS — Z79899 Other long term (current) drug therapy: Secondary | ICD-10-CM | POA: Diagnosis not present

## 2019-01-30 DIAGNOSIS — J90 Pleural effusion, not elsewhere classified: Secondary | ICD-10-CM | POA: Diagnosis not present

## 2019-01-30 DIAGNOSIS — R079 Chest pain, unspecified: Secondary | ICD-10-CM | POA: Diagnosis not present

## 2019-01-30 DIAGNOSIS — E118 Type 2 diabetes mellitus with unspecified complications: Secondary | ICD-10-CM | POA: Diagnosis present

## 2019-01-30 LAB — BASIC METABOLIC PANEL
Anion gap: 11 (ref 5–15)
BUN: 26 mg/dL — ABNORMAL HIGH (ref 8–23)
CO2: 20 mmol/L — ABNORMAL LOW (ref 22–32)
Calcium: 8.9 mg/dL (ref 8.9–10.3)
Chloride: 103 mmol/L (ref 98–111)
Creatinine, Ser: 1.07 mg/dL — ABNORMAL HIGH (ref 0.44–1.00)
GFR calc Af Amer: 57 mL/min — ABNORMAL LOW (ref >60)
GFR calc non Af Amer: 49 mL/min — ABNORMAL LOW (ref >60)
Glucose, Bld: 168 mg/dL — ABNORMAL HIGH (ref 70–99)
Potassium: 4.1 mmol/L (ref 3.5–5.1)
Sodium: 134 mmol/L — ABNORMAL LOW (ref 135–145)

## 2019-01-30 LAB — COMPREHENSIVE METABOLIC PANEL
ALT: 15 U/L (ref 0–44)
AST: 18 U/L (ref 15–41)
Albumin: 3 g/dL — ABNORMAL LOW (ref 3.5–5.0)
Alkaline Phosphatase: 77 U/L (ref 38–126)
Anion gap: 10 (ref 5–15)
BUN: 27 mg/dL — ABNORMAL HIGH (ref 8–23)
CO2: 20 mmol/L — ABNORMAL LOW (ref 22–32)
Calcium: 8.6 mg/dL — ABNORMAL LOW (ref 8.9–10.3)
Chloride: 102 mmol/L (ref 98–111)
Creatinine, Ser: 1.23 mg/dL — ABNORMAL HIGH (ref 0.44–1.00)
GFR calc Af Amer: 48 mL/min — ABNORMAL LOW (ref >60)
GFR calc non Af Amer: 41 mL/min — ABNORMAL LOW (ref >60)
Glucose, Bld: 260 mg/dL — ABNORMAL HIGH (ref 70–99)
Potassium: 5.1 mmol/L (ref 3.5–5.1)
Sodium: 132 mmol/L — ABNORMAL LOW (ref 135–145)
Total Bilirubin: 0.7 mg/dL (ref 0.3–1.2)
Total Protein: 7.4 g/dL (ref 6.5–8.1)

## 2019-01-30 LAB — CBC
HCT: 39.1 % (ref 36.0–46.0)
Hemoglobin: 12.1 g/dL (ref 12.0–15.0)
MCH: 26.5 pg (ref 26.0–34.0)
MCHC: 30.9 g/dL (ref 30.0–36.0)
MCV: 85.7 fL (ref 80.0–100.0)
Platelets: 339 10*3/uL (ref 150–400)
RBC: 4.56 MIL/uL (ref 3.87–5.11)
RDW: 13.7 % (ref 11.5–15.5)
WBC: 11 10*3/uL — ABNORMAL HIGH (ref 4.0–10.5)
nRBC: 0 % (ref 0.0–0.2)

## 2019-01-30 LAB — FIBRIN DERIVATIVES D-DIMER (ARMC ONLY): Fibrin derivatives D-dimer (ARMC): 2019.8 ng/mL (FEU) — ABNORMAL HIGH (ref 0.00–499.00)

## 2019-01-30 LAB — TROPONIN I (HIGH SENSITIVITY)
Troponin I (High Sensitivity): 17 ng/L (ref <18)
Troponin I (High Sensitivity): 15 ng/L (ref <18)

## 2019-01-30 LAB — BRAIN NATRIURETIC PEPTIDE: B Natriuretic Peptide: 864 pg/mL — ABNORMAL HIGH (ref 0.0–100.0)

## 2019-01-30 LAB — SARS CORONAVIRUS 2 BY RT PCR (HOSPITAL ORDER, PERFORMED IN ~~LOC~~ HOSPITAL LAB): SARS Coronavirus 2: NEGATIVE

## 2019-01-30 LAB — GLUCOSE, CAPILLARY: Glucose-Capillary: 288 mg/dL — ABNORMAL HIGH (ref 70–99)

## 2019-01-30 MED ORDER — ACETAMINOPHEN 325 MG PO TABS: 650.0000 mg | ORAL_TABLET | Freq: Four times a day (QID) | ORAL | Status: DC | PRN

## 2019-01-30 MED ORDER — LEVOTHYROXINE SODIUM 137 MCG PO TABS
137.0000 ug | ORAL_TABLET | Freq: Every day | ORAL | Status: DC
  Administered 2019-01-31 – 2019-02-01 (×2): 137 ug via ORAL
  Filled 2019-01-30 (×2): qty 1

## 2019-01-30 MED ORDER — SODIUM CHLORIDE 0.9% FLUSH: 3.0000 mL | INTRAVENOUS | Status: DC | PRN

## 2019-01-30 MED ORDER — FUROSEMIDE 10 MG/ML IJ SOLN: 40.0000 mg | Freq: Two times a day (BID) | INTRAMUSCULAR | Status: DC

## 2019-01-30 MED ORDER — IOHEXOL 350 MG/ML SOLN
75.0000 mL | Freq: Once | INTRAVENOUS | Status: AC | PRN
  Administered 2019-01-30: 75 mL via INTRAVENOUS

## 2019-01-30 MED ORDER — PANTOPRAZOLE SODIUM 40 MG PO TBEC
40.0000 mg | DELAYED_RELEASE_TABLET | Freq: Every day | ORAL | Status: DC
  Administered 2019-01-31 – 2019-02-01 (×2): 40 mg via ORAL
  Filled 2019-01-30 (×2): qty 1

## 2019-01-30 MED ORDER — ONDANSETRON HCL 4 MG PO TABS: 4.0000 mg | ORAL_TABLET | Freq: Four times a day (QID) | ORAL | Status: DC | PRN

## 2019-01-30 MED ORDER — ROPINIROLE HCL 0.25 MG PO TABS
0.2500 mg | ORAL_TABLET | Freq: Two times a day (BID) | ORAL | Status: DC
  Administered 2019-01-31 – 2019-02-01 (×4): 0.25 mg via ORAL
  Filled 2019-01-30 (×5): qty 1

## 2019-01-30 MED ORDER — INSULIN ASPART 100 UNIT/ML ~~LOC~~ SOLN
0.0000 [IU] | SUBCUTANEOUS | Status: DC
  Administered 2019-01-31: 20:00:00 1 [IU] via SUBCUTANEOUS
  Administered 2019-01-31: 3 [IU] via SUBCUTANEOUS
  Administered 2019-01-31: 1 [IU] via SUBCUTANEOUS
  Administered 2019-01-31: 5 [IU] via SUBCUTANEOUS
  Administered 2019-02-01: 12:00:00 3 [IU] via SUBCUTANEOUS
  Filled 2019-01-30 (×4): qty 1

## 2019-01-30 MED ORDER — SODIUM CHLORIDE 0.9 % IV SOLN
250.0000 mL | INTRAVENOUS | Status: DC | PRN
  Administered 2019-01-31 – 2019-02-01 (×2): 250 mL via INTRAVENOUS

## 2019-01-30 MED ORDER — TRAMADOL HCL 50 MG PO TABS: 50.0000 mg | ORAL_TABLET | Freq: Two times a day (BID) | ORAL | Status: DC | PRN

## 2019-01-30 MED ORDER — ONDANSETRON HCL 4 MG/2ML IJ SOLN: 4.0000 mg | Freq: Four times a day (QID) | INTRAMUSCULAR | Status: DC | PRN

## 2019-01-30 MED ORDER — ACETAMINOPHEN 650 MG RE SUPP: 650.0000 mg | Freq: Four times a day (QID) | RECTAL | Status: DC | PRN

## 2019-01-30 MED ORDER — SODIUM CHLORIDE 0.9% FLUSH
3.0000 mL | Freq: Two times a day (BID) | INTRAVENOUS | Status: DC
  Administered 2019-01-31 – 2019-02-01 (×4): 3 mL via INTRAVENOUS

## 2019-01-30 MED ORDER — ENOXAPARIN SODIUM 40 MG/0.4ML ~~LOC~~ SOLN
40.0000 mg | SUBCUTANEOUS | Status: DC
  Administered 2019-01-31 – 2019-02-01 (×2): 40 mg via SUBCUTANEOUS
  Filled 2019-01-30 (×2): qty 0.4

## 2019-01-30 MED ORDER — BASAGLAR KWIKPEN 100 UNIT/ML ~~LOC~~ SOPN: 8.0000 [IU] | PEN_INJECTOR | Freq: Two times a day (BID) | SUBCUTANEOUS | Status: DC

## 2019-01-30 MED ORDER — NEBIVOLOL HCL 10 MG PO TABS
20.0000 mg | ORAL_TABLET | Freq: Every day | ORAL | Status: DC
  Administered 2019-01-31 – 2019-02-01 (×2): 20 mg via ORAL
  Filled 2019-01-30 (×2): qty 2

## 2019-01-30 MED ORDER — ALBUTEROL SULFATE (2.5 MG/3ML) 0.083% IN NEBU: 2.5000 mg | INHALATION_SOLUTION | Freq: Four times a day (QID) | RESPIRATORY_TRACT | Status: DC | PRN

## 2019-01-30 MED ORDER — GABAPENTIN 100 MG PO CAPS
100.0000 mg | ORAL_CAPSULE | Freq: Three times a day (TID) | ORAL | Status: DC
  Administered 2019-01-31 – 2019-02-01 (×5): 100 mg via ORAL
  Filled 2019-01-30 (×5): qty 1

## 2019-01-30 MED ORDER — ATORVASTATIN CALCIUM 20 MG PO TABS: 20.0000 mg | ORAL_TABLET | Freq: Every day | ORAL | Status: DC

## 2019-01-30 MED ORDER — ESCITALOPRAM OXALATE 10 MG PO TABS
20.0000 mg | ORAL_TABLET | Freq: Every day | ORAL | Status: DC
  Administered 2019-01-31 (×2): 20 mg via ORAL
  Filled 2019-01-30 (×3): qty 2

## 2019-01-30 MED ORDER — ASPIRIN EC 81 MG PO TBEC
81.0000 mg | DELAYED_RELEASE_TABLET | Freq: Every day | ORAL | Status: DC
  Administered 2019-01-31 – 2019-02-01 (×2): 81 mg via ORAL
  Filled 2019-01-30 (×2): qty 1

## 2019-01-30 MED ORDER — ONDANSETRON HCL 4 MG/2ML IJ SOLN
4.0000 mg | Freq: Once | INTRAMUSCULAR | Status: AC
  Administered 2019-01-30: 4 mg via INTRAVENOUS
  Filled 2019-01-30: qty 2

## 2019-01-30 MED ORDER — NITROGLYCERIN 2 % TD OINT
1.0000 [in_us] | TOPICAL_OINTMENT | Freq: Once | TRANSDERMAL | Status: AC
  Administered 2019-01-30: 1 [in_us] via TOPICAL
  Filled 2019-01-30: qty 1

## 2019-01-30 MED ORDER — FUROSEMIDE 10 MG/ML IJ SOLN
80.0000 mg | Freq: Once | INTRAMUSCULAR | Status: AC
  Administered 2019-01-30: 80 mg via INTRAVENOUS
  Filled 2019-01-30: qty 8

## 2019-01-30 MED ORDER — MOMETASONE FURO-FORMOTEROL FUM 200-5 MCG/ACT IN AERO
2.0000 | INHALATION_SPRAY | Freq: Two times a day (BID) | RESPIRATORY_TRACT | Status: DC
  Administered 2019-01-31 – 2019-02-01 (×4): 2 via RESPIRATORY_TRACT
  Filled 2019-01-30: qty 8.8

## 2019-01-30 NOTE — ED Notes (Signed)
Attempted to call report, ICU refused pending COVID results

## 2019-01-30 NOTE — ED Notes (Signed)
ED TO INPATIENT HANDOFF REPORT  ED Nurse Name and Phone #: Orpha BurKaty [phone]  S Name/Age/Gender Evelyn Stark 78 y.o. female Room/Bed: ED14A/ED14A  Code Status   Code Status: Not on file  Home/SNF/Other Home Patient oriented to: self, place and situation Is this baseline? No   Triage Complete: Triage complete  Chief Complaint Breathing Difficulty  Triage Note Pt arrives from home via [facility] for SOB. Was 80% on RA, came up to 90% on 6 L . Received 1 duoneb with EMS. Coughing and vomiting upon arrival. 167/84 with EMS. CBG 157.   Pt does NOT speak any [place], family member with pt to translate.    Allergies No Known Allergies  Level of Care/Admitting Diagnosis ED Disposition    ED Disposition Condition Comment   Admit  Hospital Area: [facility] [[id number]]  Level of Care: Stepdown [14]  Covid Evaluation: Asymptomatic Screening Protocol (No Symptoms)  Diagnosis: CHF exacerbation [facility](HCC) [[id number]]) [[id number]]  Admitting Physician: Therisa DoyneUTOVA, ANASTASSIA [[id number]]  Attending Physician: Therisa DoyneUTOVA, ANASTASSIA [[id number]]  Estimated length of stay: 3 - 4 days  Certification:: I certify this patient will need inpatient services for at least 2 midnights  PT Class (Do Not Modify): Inpatient [101]  PT Acc Code (Do Not Modify): Private [1]       B Medical/Surgery History Past Medical History:  Diagnosis Date  . Asthma   . Coronary artery disease   . Depression   . Diabetes mellitus without complication (HCC)   . Hyperlipidemia   . Hypertension   . Hypothyroidism    Past Surgical History:  Procedure Laterality Date  . CHOLECYSTECTOMY N/A 11/20/2015   Procedure: LAPAROSCOPIC CHOLECYSTECTOMY WITH INTRAOPERATIVE CHOLANGIOGRAM;  Surgeon: Kieth BrightlySeeplaputhur G Sankar, MD;  Location: [facility] ORS;  Service: General;  Laterality: N/A;  . COLONOSCOPY  2000  . coronary stents     x 3  . EYE SURGERY    . LEFT HEART CATHETERIZATION WITH CORONARY ANGIOGRAM N/A 11/27/2012   Procedure: LEFT HEART  CATHETERIZATION WITH CORONARY ANGIOGRAM;  Surgeon: Robynn PaneMohan N Harwani, MD;  [facility];  Service: Cardiovascular;  Laterality: N/A;  . MINOR HEMORRHOIDECTOMY       A IV Location/Drains/Wounds Patient Lines/Drains/Airways Status   Active Line/Drains/Airways    Name:   Placement date:   Placement time:   Site:   Days:   Peripheral IV 03/03/11 Right;Posterior Forearm   03/03/11    1040    Forearm   2890   Peripheral IV 01/30/19 Left Antecubital   01/30/19    1833    Antecubital   less than 1   Incision (Closed) 11/20/15 Abdomen   11/20/15    1145     1167          Intake/Output Last 24 hours No intake or output data in the 24 hours ending 01/30/19 2029  Labs/Imaging Results for orders placed or performed during the hospital encounter of 01/30/19 (from the past 48 hour(s))  Basic metabolic panel     Status: Abnormal   Collection Time: 01/30/19  6:28 PM  Result Value Ref Range   Sodium 134 (L) 135 - 145 mmol/L   Potassium 4.1 3.5 - 5.1 mmol/L   Chloride 103 98 - 111 mmol/L   CO2 20 (L) 22 - 32 mmol/L   Glucose, Bld 168 (H) 70 - 99 mg/dL   BUN 26 (H) 8 - 23 mg/dL   Creatinine, Ser 6.571.07 (H) 0.44 - 1.00 mg/dL   Calcium 8.9 8.9 - 84.610.3 mg/dL  GFR calc non Af Amer 49 (L) >60 mL/min   GFR calc Af Amer 57 (L) >60 mL/min   Anion gap 11 5 - 15    Comment: Performed at [facility], [address] [place], [place] [address]  CBC     Status: Abnormal   Collection Time: 01/30/19  6:28 PM  Result Value Ref Range   WBC 11.0 (H) 4.0 - 10.5 K/uL   RBC 4.56 3.87 - 5.11 MIL/uL   Hemoglobin 12.1 12.0 - 15.0 g/dL   HCT 36.1 44.3 - 15.4 %   MCV 85.7 80.0 - 100.0 fL   MCH 26.5 26.0 - 34.0 pg   MCHC 30.9 30.0 - 36.0 g/dL   RDW 00.8 67.6 - 19.5 %   Platelets 339 150 - 400 K/uL   nRBC 0.0 0.0 - 0.2 %    Comment: Performed at [facility], [address], [place], [place] [address]  Troponin I (High Sensitivity)     Status: None   Collection Time: 01/30/19  6:28 PM   Result Value Ref Range   Troponin I (High Sensitivity) 17 <18 ng/L    Comment: (NOTE) Elevated high sensitivity troponin I (hsTnI) values and significant  changes across serial measurements may suggest ACS but many other  chronic and acute conditions are known to elevate hsTnI results.  Refer to the "Links" section for chest pain algorithms and additional  guidance. Performed at [facility], [address] [place], [place] [address]   Brain natriuretic peptide     Status: Abnormal   Collection Time: 01/30/19  6:32 PM  Result Value Ref Range   B Natriuretic Peptide 864.0 (H) 0.0 - 100.0 pg/mL    Comment: Performed at [facility], [address], [place], [place] [address]   Dg Chest Portable 1 View  Result Date: 01/30/2019 CLINICAL DATA:  Shortness of breath. Cough. EXAM: PORTABLE CHEST 1 VIEW COMPARISON:  Chest CT 12/16/2015 FINDINGS: Low lung volumes. Borderline cardiomegaly. There are bilateral pleural effusions, left greater than right, with adjacent compressive atelectasis. Peribronchial and interstitial thickening most consistent pulmonary edema. No pneumothorax. No acute osseous abnormalities are seen. IMPRESSION: Diffuse interstitial and bronchial thickening suspicious for pulmonary edema. Small bilateral pleural effusions with borderline cardiomegaly. Overall findings are most consistent with CHF. Aortic Atherosclerosis (ICD10-I70.0). Electronically Signed   By: Narda Rutherford M.D.   On: 01/30/2019 18:59    Pending Labs Unresulted Labs (From admission, onward)    Start     Ordered   01/30/19 1917  SARS CORONAVIRUS 2 (TAT 6-24 HRS) Nasopharyngeal Nasopharyngeal Swab  (Asymptomatic/Tier 3)  Once,   STAT    Question Answer Comment  Is this test for diagnosis or screening Screening   Symptomatic for COVID-19 as defined by CDC No   Hospitalized for COVID-19 No   Admitted to ICU for COVID-19 No   Previously tested for COVID-19 No   Resident in a  congregate (group) care setting No   Employed in healthcare setting No   Pregnant No      01/30/19 1916   Signed and Held  Hemoglobin A1c  Tomorrow morning,   R    Comments: To assess prior glycemic control    Signed and Held          Vitals/Pain Today's Vitals   01/30/19 1830 01/30/19 1832 01/30/19 1900 01/30/19 1919  BP:   (!) 177/64   Pulse:   79   Resp:   (!) 24  Temp:  97.7 F (36.5 C)    TempSrc:  Oral    SpO2:   91%   Weight: 72.6 kg     Height: 5\' 4"  (1.626 m)     PainSc: 0-No pain   0-No pain    Isolation Precautions No active isolations  Medications Medications  ondansetron (ZOFRAN) injection 4 mg (4 mg Intravenous Given 01/30/19 1833)  nitroGLYCERIN (NITROGLYN) 2 % ointment 1 inch (1 inch Topical Given 01/30/19 1916)  furosemide (LASIX) injection 80 mg (80 mg Intravenous Given 01/30/19 1916)    Mobility non-ambulatory Moderate fall risk   Focused Assessments Pulmonary Assessment Handoff:  Lung sounds: Bilateral Breath Sounds: Diminished L Breath Sounds: Fine crackles R Breath Sounds: Fine crackles O2 Device: Nasal Cannula O2 Flow Rate (L/min): 4 L/min      R Recommendations: See Admitting Provider Note  Report given to:   Additional Notes: Patient came in SOB, currently on 4L O2 via [place]. She is not english speaking, her son in law has been at the bedside translating. No reports of pain, Vital signs stable. Has not voided since 80 mg lasix administration approximately one hour ago.

## 2019-01-30 NOTE — ED Provider Notes (Signed)
[facility] Emergency Department Provider Note   ____________________________________________   First MD Initiated Contact with Patient 01/30/19 1830     (approximate)  I have reviewed the triage vital signs and the nursing notes.   HISTORY  Chief Complaint Shortness of Breath    HPI Evelyn Stark is a 78 y.o. female with past medical history of CAD, CHF, hypertension, diabetes who presents to the ED for shortness of breath.  History is provided by patient's son-in-law, who is a pulmonologist, as patient does not speak [place].  He states that she had not been feeling well for the past 2 to 3 days but seem to have significant worsening in her breathing throughout the day today.  She has not had any fevers or cough and denies any chest pain, however he states that she has had MIs in the past without experiencing any chest pain.  He is not aware of any sick contacts or COVID-19 exposures.  She has not complained of any pain or swelling in her legs.  Patient was noted to have O2 sats of 80% on room air at the time of EMS arrival, was subsequently placed on 6 L nasal cannula with improvement to 90%.        Past Medical History:  Diagnosis Date  . Asthma   . Coronary artery disease   . Depression   . Diabetes mellitus without complication ([place])   . Hyperlipidemia   . Hypertension   . Hypothyroidism     Patient Active Problem List   Diagnosis Date Noted  . CHF exacerbation ([place]) 01/30/2019    Past Surgical History:  Procedure Laterality Date  . CHOLECYSTECTOMY N/A 11/20/2015   Procedure: LAPAROSCOPIC CHOLECYSTECTOMY WITH INTRAOPERATIVE CHOLANGIOGRAM;  Surgeon: Christene Lye, MD;  Location: [facility] ORS;  Service: General;  Laterality: N/A;  . COLONOSCOPY  2000  . coronary stents     x 3  . EYE SURGERY    . LEFT HEART CATHETERIZATION WITH CORONARY ANGIOGRAM N/A 11/27/2012   Procedure: LEFT HEART CATHETERIZATION WITH CORONARY ANGIOGRAM;  Surgeon:  Clent Demark, MD;  Location: [facility] C[facility] LAB;  Service: Cardiovascular;  Laterality: N/A;  . MINOR HEMORRHOIDECTOMY      Prior to Admission medications   Medication Sig Start Date End Date Taking? Authorizing Provider  ALPRAZolam (XANAX) 0.25 MG tablet Take 1 tablet (0.25 mg total) by mouth 2 (two) times daily as needed for sleep or anxiety. 01/28/19  Yes Lavera Guise, MD  amLODipine (NORVASC) 10 MG tablet Take 1 tablet (10 mg total) by mouth 2 (two) times daily. 10/03/18  Yes Lavera Guise, MD  aspirin EC 81 MG tablet Take 81 mg by mouth daily.   Yes [provider]  atorvastatin (LIPITOR) 40 MG tablet Take 0.5 tablets (20 mg total) by mouth daily. 01/28/19  Yes Lavera Guise, MD  budesonide-formoterol [facility]) 160-4.5 MCG/ACT inhaler Inhale 2 puffs into the lungs 2 (two) times daily as needed. 10/03/18  Yes Lavera Guise, MD  escitalopram (LEXAPRO) 20 MG tablet Take one tab po qd for depression 01/28/19  Yes Lavera Guise, MD  esomeprazole (NEXIUM) 40 MG capsule Take 1 capsule (40 mg total) by mouth daily. 01/28/19  Yes Lavera Guise, MD  gabapentin (NEURONTIN) 100 MG capsule Take 1 capsule (100 mg total) by mouth 3 (three) times daily. 12/25/18  Yes Boscia, Heather E, NP  glimepiride (AMARYL) 2 MG tablet Take 1 tablet (2 mg total) by mouth 2 (two)  times daily. 12/25/18  Yes Boscia, Greer Ee, NP  hydrALAZINE (APRESOLINE) 100 MG tablet Take 1 tablet (100 mg total) by mouth 3 (three) times daily. Patient taking differently: Take 100 mg by mouth 2 (two) times daily.  10/03/18  Yes Lavera Guise, MD  Insulin Glargine [facility]) 100 UNIT/ML SOPN Use 20-25 units with supper qd Patient taking differently: Inject 8-12 Units into the skin 2 (two) times daily. Inject 8 units daily in the morning and 12 units daily in the evening 12/18/18  Yes Lavera Guise, MD  levothyroxine (SYNTHROID) 137 MCG tablet Take 1 tablet (137 mcg total) by mouth daily before breakfast. 10/03/18  Yes Lavera Guise, MD  Nebivolol HCl (BYSTOLIC) 20 MG TABS Take one tab po bid Patient taking differently: Take 20 mg by mouth daily.  10/03/18  Yes Lavera Guise, MD  rOPINIRole (REQUIP) 0.25 MG tablet Take 1 tablet (0.25 mg total) by mouth 2 (two) times daily. 12/25/18  Yes Boscia, Greer Ee, NP  zolpidem (AMBIEN) 5 MG tablet Take 1 tablet (5 mg total) by mouth at bedtime. 10/03/18  Yes Lavera Guise, MD  azithromycin [facility]) 250 MG tablet Take one tab a day for 10 days for uri Patient not taking: Reported on 01/30/2019 10/03/18   Lavera Guise, MD  Blood Glucose Monitoring Suppl [facility] BLOOD GLUCOSE SYSTEM) w/Device KIT Use as directed diag E11.65 09/29/17   Ronnell Freshwater, NP  Blood Glucose Monitoring Suppl (CONTOUR NEXT MONITOR) w/Device KIT 1 each by Does not apply route daily. Use as directed Diag E11.65 12/25/18   Ronnell Freshwater, NP  canagliflozin (INVOKANA) 300 MG TABS tablet Take 1 tablet (300 mg total) by mouth daily before breakfast. Patient not taking: Reported on 01/30/2019 10/03/18   Lavera Guise, MD  glucose blood (CONTOUR NEXT TEST) test strip Use as instructed three times a daily diag E11.65 12/26/18   Ronnell Freshwater, NP  glucose blood (CONTOUR TEST) test strip 1 each by Other route 3 (three) times daily. diag e11.65 09/29/17   Ronnell Freshwater, NP  Insulin Pen Needle (BD PEN NEEDLE NANO U/F) 32G X 4 MM MISC Use as directed with insulin 01/01/19   Ronnell Freshwater, NP  mirtazapine (REMERON) 7.5 MG tablet Take one tab po qhs for insomnia Patient not taking: Reported on 01/30/2019 12/18/18   Lavera Guise, MD  nystatin (MYCOSTATIN) 100000 UNIT/ML suspension Take 5 mLs (500,000 Units total) by mouth 4 (four) times daily. Patient not taking: Reported on 01/30/2019 09/29/17   Lavera Guise, MD  oxyCODONE (OXY IR/ROXICODONE) 5 MG immediate release tablet Take 1 tablet (5 mg total) by mouth every 4 (four) hours as needed for severe pain. 01/29/19   Kendell Bane, NP  pimecrolimus (ELIDEL)  1 % cream Apply topically 2 (two) times daily. Patient not taking: Reported on 01/30/2019 09/29/17   Lavera Guise, MD  Plecanatide (TRULANCE) 3 MG TABS Take 3 mg by mouth daily. Patient not taking: Reported on 01/30/2019 10/03/18   Lavera Guise, MD  saxagliptin HCl (ONGLYZA) 5 MG TABS tablet Take 1 tablet (5 mg total) by mouth daily. Patient not taking: Reported on 01/30/2019 10/03/18   Lavera Guise, MD  traMADol (ULTRAM) 50 MG tablet Take 1 tablet (50 mg total) by mouth 2 (two) times daily. 12/24/18   Ronnell Freshwater, NP  traZODone (DESYREL) 50 MG tablet Take one 2 tabs at night for sleep Patient not taking: Reported on  01/30/2019 09/29/17   Lavera Guise, MD    Allergies Patient has no known allergies.  Family History  Family history unknown: Yes    Social History Social History   Tobacco Use  . Smoking status: Never Smoker  . Smokeless tobacco: Never Used  Substance Use Topics  . Alcohol use: No  . Drug use: No    Review of Systems  Constitutional: No fever/chills Eyes: No visual changes. ENT: No sore throat. Cardiovascular: Denies chest pain. Respiratory: Positive for shortness of breath. Gastrointestinal: No abdominal pain.  No nausea, no vomiting.  No diarrhea.  No constipation. Genitourinary: Negative for dysuria. Musculoskeletal: Negative for back pain. Skin: Negative for rash. Neurological: Negative for headaches, focal weakness or numbness.  ____________________________________________   PHYSICAL EXAM:  VITAL SIGNS: ED Triage Vitals  Enc Vitals Group     BP 01/30/19 1828 (!) 185/64     Pulse Rate 01/30/19 1828 83     Resp 01/30/19 1828 20     Temp --      Temp src --      SpO2 01/30/19 1828 93 %     Weight 01/30/19 1830 160 lb (72.6 kg)     Height 01/30/19 1830 _0  (1.626 m)     Head Circumference --      Peak Flow --      Pain Score 01/30/19 1830 0     Pain Loc --      Pain Edu? --      Excl. in [place]? --     Constitutional: Alert and  oriented. Eyes: Conjunctivae are normal. Head: Atraumatic. Nose: No congestion/rhinnorhea. Mouth/Throat: Mucous membranes are moist. Neck: Normal ROM Cardiovascular: Normal rate, regular rhythm. Grossly normal heart sounds. Respiratory: Tachypneic.  No retractions. Lungs with crackles to bilateral bases. Gastrointestinal: Soft and nontender. No distention. Genitourinary: deferred Musculoskeletal: No lower extremity tenderness nor edema. Neurologic:  Normal speech and language. No gross focal neurologic deficits are appreciated. Skin:  Skin is warm, dry and intact. No rash noted. Psychiatric: Mood and affect are normal. Speech and behavior are normal.  ____________________________________________   LABS (all labs ordered are listed, but only abnormal results are displayed)  Labs Reviewed  BASIC METABOLIC PANEL - Abnormal; Notable for the following components:      Result Value   Sodium 134 (*)    CO2 20 (*)    Glucose, Bld 168 (*)    BUN 26 (*)    Creatinine, Ser 1.07 (*)    GFR calc non Af Amer 49 (*)    GFR calc Af Amer 57 (*)    All other components within normal limits  CBC - Abnormal; Notable for the following components:   WBC 11.0 (*)    All other components within normal limits  BRAIN NATRIURETIC PEPTIDE - Abnormal; Notable for the following components:   B Natriuretic Peptide 864.0 (*)    All other components within normal limits  SARS CORONAVIRUS 2 (TAT 6-24 HRS)  TROPONIN I (HIGH SENSITIVITY)  TROPONIN I (HIGH SENSITIVITY)   ____________________________________________  EKG  ED ECG REPORT I, Blake Divine, the attending physician, personally viewed and interpreted this ECG.   Date: 01/30/2019  EKG Time: 18:38  Rate: 74  Rhythm: normal sinus rhythm  Axis: Normal  Intervals:none  ST&T Change: None   PROCEDURES  Procedure(s) performed (including Critical Care):  .Critical Care Performed by: Blake Divine, MD Authorized by: Blake Divine, MD    Critical care provider statement:  Critical care time (minutes):  45   Critical care time was exclusive of:  Separately billable procedures and treating other patients and teaching time   Critical care was necessary to treat or prevent imminent or life-threatening deterioration of the following conditions:  Respiratory failure   Critical care was time spent personally by me on the following activities:  Discussions with consultants, evaluation of patient's response to treatment, examination of patient, ordering and performing treatments and interventions, ordering and review of laboratory studies, ordering and review of radiographic studies, pulse oximetry, re-evaluation of patient's condition, obtaining history from patient or surrogate and review of old charts   I assumed direction of critical care for this patient from another provider in my specialty: no       ____________________________________________   INITIAL IMPRESSION / ASSESSMENT AND PLAN / ED COURSE       78 year old female with history of CAD and CHF presents to the ED with worsening shortness of breath throughout the day today, requiring 6 L by nasal cannula per EMS.  She is not in any significant respiratory distress but did become significantly hypoxic when being transitioned from EMS oxygen to wall oxygen.  EKG shows no acute ischemic changes, will check troponin.  No wheezing to suggest bronchospasm and patient has no history of COPD.  Given relatively sudden onset and patient's recent flight from [place], initial plan was for CTA to rule out PE.  However, chest x-ray showed significant pulmonary edema consistent with CHF.  Nitro placed on patient's chest and she was given a dose of Lasix with improvement in her breathing.  Troponin within normal limits, do not suspect ACS.  Given alternative explanation, doubt PE.  Case discussed with hospitalist, who accepts patient for admission.       ____________________________________________   FINAL CLINICAL IMPRESSION(S) / ED DIAGNOSES  Final diagnoses:  Acute on chronic congestive heart failure, unspecified heart failure type ([place])  Shortness of breath     ED Discharge Orders    None       Note:  This document was prepared using Dragon voice recognition software and may include unintentional dictation errors.   Blake Divine, MD 01/30/19 2056

## 2019-01-30 NOTE — H&P (Signed)
Evelyn Stark WRU:[id number] DOB: 05/06/1940 DOA: 01/30/2019     PCP: Ronnell Freshwater, NP   Outpatient Specialists:   CARDS:  Dr. Terrence Dupont in the past Dr. Fletcher Anon   Pulmonary   Dr.Khan   Patient arrived to ER on 01/30/19 at 1823  Patient coming from: home Lives   With family    Chief Complaint:    Chief Complaint  Patient presents with   Shortness of Breath    HPI: Evelyn Stark is a 78 y.o. female originally from [place] does not speak English with medical history significant of left 2 diabetes uncontrolled CAD depression, B12 deficiency, hypertension, hypothyroidism out of hearing    Presented with   significant shortness of breath.  EMS was only 80% room air improved up to 90% on 6 L patient received DuoNeb with EMS continued to cough had some weezing she used her inhaler but got worse She was lightheaded with using the bathroom Family checked her pulse ox it was in the 70% They tried to use home concentrator and she got better her oxygen concentration went up to 89% on 3L Family decided to call EMS She not on o2 at baseline they had it from prior hospitalization She traveled from [place] 1st week of October No fever at home No N/V/D Family reports chest tightness She has no known hx of CHF   CBG 157    Of note patient has positive ANA antibodies in the past Infectious risk factors:  Reports  shortness of breath,   cough,   In  ER RAPID COVID TEST  in house testing  Pending  Lab Results  Component Value Date   [place] NEGATIVE 01/30/2019     Regarding pertinent Chronic problems:    Hyperlipidemia -  on statins Lipitor   HTN on Norvasc drowsing   CAD  - On Aspirin, statin, betablocker                 - followed by cardiology                - last cardiac cath family unsure when   DM 2 -  Lab Results  Component Value Date   HGBA1C 7.8 (H) 01/01/2019   on Invokana and glargine   Hypothyroidism:  Lab Results  Component Value Date   TSH 2.780  01/01/2019   on synthroid    asthma - followed by pulmonology  Not on baseline oxygen  *L,      CKD stage III - baseline Cr 1.2 Lab Results  Component Value Date   CREATININE 1.07 (H) 01/30/2019   CREATININE 1.22 (H) 01/01/2019      While in ER:  Noted to have slightly elevated BNP 864 White blood cell count elevated at 11 X-ray showed evidence of pulmonary edema bilateral pleural effusions cardiomegaly consistent with CHF Patient was given Lasix 80 mg IV in the nitro paste     The following Work up has been ordered so far:  Orders Placed This Encounter  Procedures   SARS CORONAVIRUS 2 (TAT 6-24 HRS) Nasopharyngeal Nasopharyngeal Swab   DG Chest Portable 1 View   Basic metabolic panel   CBC   Brain natriuretic peptide   Cardiac monitoring   Saline Lock IV, Maintain IV access   Consult to hospitalist  ALL PATIENTS BEING ADMITTED/HAVING PROCEDURES NEED COVID-19 SCREENING   Pulse oximetry, continuous   ED EKG    Following Medications were ordered in ER: Medications  ondansetron (ZOFRAN)  injection 4 mg (4 mg Intravenous Given 01/30/19 1833)  nitroGLYCERIN (NITROGLYN) 2 % ointment 1 inch (1 inch Topical Given 01/30/19 1916)  furosemide (LASIX) injection 80 mg (80 mg Intravenous Given 01/30/19 1916)        Consult Orders  (From admission, onward)         Start     Ordered   01/30/19 1920  Consult to hospitalist  ALL PATIENTS BEING ADMITTED/HAVING PROCEDURES NEED COVID-19 SCREENING  Once    Comments: ALL PATIENTS BEING ADMITTED/HAVING PROCEDURES NEED COVID-19 SCREENING  Provider:  (Not yet assigned)  Question Answer Comment  Place call to: [phone]   Reason for Consult Admit      01/30/19 1919           Significant initial  Findings: Abnormal Labs Reviewed  BASIC METABOLIC PANEL - Abnormal; Notable for the following components:      Result Value   Sodium 134 (*)    CO2 20 (*)    Glucose, Bld 168 (*)    BUN 26 (*)    Creatinine, Ser 1.07 (*)      GFR calc non Af Amer 49 (*)    GFR calc Af Amer 57 (*)    All other components within normal limits  CBC - Abnormal; Notable for the following components:   WBC 11.0 (*)    All other components within normal limits  BRAIN NATRIURETIC PEPTIDE - Abnormal; Notable for the following components:   B Natriuretic Peptide 864.0 (*)    All other components within normal limits  COMPREHENSIVE METABOLIC PANEL - Abnormal; Notable for the following components:   Sodium 132 (*)    CO2 20 (*)    Glucose, Bld 260 (*)    BUN 27 (*)    Creatinine, Ser 1.23 (*)    Calcium 8.6 (*)    Albumin 3.0 (*)    GFR calc non Af Amer 41 (*)    GFR calc Af Amer 48 (*)    All other components within normal limits  FIBRIN DERIVATIVES D-DIMER ([facility] ONLY) - Abnormal; Notable for the following components:   Fibrin derivatives D-dimer [facility]) 2,019.80 (*)    All other components within normal limits  GLUCOSE, CAPILLARY - Abnormal; Notable for the following components:   Glucose-Capillary 288 (*)    All other components within normal limits     Otherwise labs showing:    Recent Labs  Lab 01/30/19 1828 01/30/19 2130  NA 134* 132*  K 4.1 5.1  CO2 20* 20*  GLUCOSE 168* 260*  BUN 26* 27*  CREATININE 1.07* 1.23*  CALCIUM 8.9 8.6*    Cr   Stable,  Lab Results  Component Value Date   CREATININE 1.07 (H) 01/30/2019   CREATININE 1.22 (H) 01/01/2019    No results for input(s): AST, ALT, ALKPHOS, BILITOT, PROT, ALBUMIN in the last 168 hours. Lab Results  Component Value Date   CALCIUM 8.9 01/30/2019       WBC      Component Value Date/Time   WBC 11.0 (H) 01/30/2019 1828   ANC    Component Value Date/Time   NEUTROABS 4.3 01/01/2019 1111   ALC No components found for: LYMPHAB    Plt: Lab Results  Component Value Date   PLT 339 01/30/2019     Lactic Acid, Venous No results found for: LATICACIDVEN    COVID-19 Labs  No results for input(s): DDIMER, FERRITIN, LDH, CRP in the last 72  hours.  Lab Results  Component Value Date   SARSCOV2NAA NEGATIVE 01/30/2019     HG/HCT  stable,      Component Value Date/Time   HGB 12.1 01/30/2019 1828   HGB 12.6 01/01/2019 1111   HCT 39.1 01/30/2019 1828   HCT 39.5 01/01/2019 1111      Troponin  17-15 Cardiac Panel (last 3 results) No results for input(s): CKTOTAL, CKMB, TROPONINI, RELINDX in the last 72 hours.     ECG: Ordered Personally reviewed by me showing: HR : 72 Rhythm:  NSR,     no evidence of ischemic changes QTC 474   BNP (last 3 results) Recent Labs    01/30/19 1832  BNP 864.0*    ProBNP (last 3 results) No results for input(s): PROBNP in the last 8760 hours.  DM  labs:  HbA1C: Recent Labs    01/01/19 1111  HGBA1C 7.8*       CBG (last 3)  No results for input(s): GLUCAP in the last 72 hours.     UA  ordered   Urine analysis: No results found for: COLORURINE, APPEARANCEUR, [place], [place], [place], [place], [place], [place], [place], UROBILINOGEN, NITRITE, LEUKOCYTESUR      CXR -  CHF     ED Triage Vitals  Enc Vitals Group     BP 01/30/19 1828 (!) 185/64     Pulse Rate 01/30/19 1828 83     Resp 01/30/19 1828 20     Temp 01/30/19 1832 97.7 F (36.5 C)     Temp Source 01/30/19 1832 Oral     SpO2 01/30/19 1828 93 %     Weight 01/30/19 1830 160 lb (72.6 kg)     Height 01/30/19 1830 5' 4" (1.626 m)     Head Circumference --      Peak Flow --      Pain Score 01/30/19 1830 0     Pain Loc --      Pain Edu? --      Excl. in [place]? --   TMAX(24)@       Latest  Blood pressure (!) 177/64, pulse 79, temperature 97.7 F (36.5 C), temperature source Oral, resp. rate (!) 24, height 5' 4" (1.626 m), weight 72.6 kg, SpO2 91 %.    Hospitalist was called for admission for CHF exacerbation   Review of Systems:    Pertinent positives include:  shortness of breath at rest.   dyspnea on exertion,  Constitutional:  No weight loss, night sweats, Fevers, chills, fatigue, weight  loss  HEENT:  No headaches, Difficulty swallowing,Tooth/dental problems,Sore throat,  No sneezing, itching, ear ache, nasal congestion, post nasal drip,  Cardio-vascular:  No chest pain, Orthopnea, PND, anasarca, dizziness, palpitations.no Bilateral lower extremity swelling  GI:  No heartburn, indigestion, abdominal pain, nausea, vomiting, diarrhea, change in bowel habits, loss of appetite, melena, blood in stool, hematemesis Resp:  no  No excess mucus, no productive cough, No non-productive cough, No coughing up of blood.No change in color of mucus.No wheezing. Skin:  no rash or lesions. No jaundice GU:  no dysuria, change in color of urine, no urgency or frequency. No straining to urinate.  No flank pain.  Musculoskeletal:  No joint pain or no joint swelling. No decreased range of motion. No back pain.  Psych:  No change in mood or affect. No depression or anxiety. No memory loss.  Neuro: no localizing neurological complaints, no tingling, no weakness, no double vision, no gait abnormality, no slurred speech, no confusion  All systems reviewed  and apart from [place] all are negative  Past Medical History:   Past Medical History:  Diagnosis Date   Asthma    Coronary artery disease    Depression    Diabetes mellitus without complication ([place])    Hyperlipidemia    Hypertension    Hypothyroidism       Past Surgical History:  Procedure Laterality Date   CHOLECYSTECTOMY N/A 11/20/2015   Procedure: LAPAROSCOPIC CHOLECYSTECTOMY WITH INTRAOPERATIVE CHOLANGIOGRAM;  Surgeon: Christene Lye, MD;  Location: [facility] ORS;  Service: General;  Laterality: N/A;   COLONOSCOPY  2000   coronary stents     x 3   EYE SURGERY     LEFT HEART CATHETERIZATION WITH CORONARY ANGIOGRAM N/A 11/27/2012   Procedure: LEFT HEART CATHETERIZATION WITH CORONARY ANGIOGRAM;  Surgeon: Clent Demark, MD;  Location: [place] CATH LAB;  Service: Cardiovascular;  Laterality: N/A;   MINOR HEMORRHOIDECTOMY       Social History:  Ambulatory   independently  Or walker     reports that she has never smoked. She has never used smokeless tobacco. She reports that she does not drink alcohol or use drugs.     Family History:   Family History  Problem Relation Age of Onset   Diabetes Neg Hx    CAD Neg Hx    Cancer Neg Hx     Allergies: No Known Allergies   Prior to Admission medications   Medication Sig Start Date End Date Taking? Authorizing Provider  ALPRAZolam (XANAX) 0.25 MG tablet Take 1 tablet (0.25 mg total) by mouth 2 (two) times daily as needed for sleep or anxiety. 01/28/19   Lavera Guise, MD  amLODipine (NORVASC) 10 MG tablet Take 1 tablet (10 mg total) by mouth 2 (two) times daily. 10/03/18   Lavera Guise, MD  aspirin EC 81 MG tablet Take 81 mg by mouth daily.    [provider]  atorvastatin (LIPITOR) 40 MG tablet Take 0.5 tablets (20 mg total) by mouth daily. 01/28/19   Lavera Guise, MD  azithromycin [facility]) 250 MG tablet Take one tab a day for 10 days for uri 10/03/18   Lavera Guise, MD  Blood Glucose Monitoring Suppl [facility] BLOOD GLUCOSE SYSTEM) w/Device KIT Use as directed diag E11.65 09/29/17   Ronnell Freshwater, NP  Blood Glucose Monitoring Suppl (CONTOUR NEXT MONITOR) w/Device KIT 1 each by Does not apply route daily. Use as directed Diag E11.65 12/25/18   Ronnell Freshwater, NP  budesonide-formoterol (SYMBICORT) 160-4.5 MCG/ACT inhaler Inhale 2 puffs into the lungs 2 (two) times daily as needed. 10/03/18   Lavera Guise, MD  canagliflozin [facility]) 300 MG TABS tablet Take 1 tablet (300 mg total) by mouth daily before breakfast. 10/03/18   Lavera Guise, MD  escitalopram (LEXAPRO) 20 MG tablet Take one tab po qd for depression 01/28/19   Lavera Guise, MD  esomeprazole (NEXIUM) 40 MG capsule Take 1 capsule (40 mg total) by mouth daily. 01/28/19   Lavera Guise, MD  gabapentin (NEURONTIN) 100 MG capsule Take 1 capsule (100 mg total) by mouth 3 (three)  times daily. 12/25/18   Ronnell Freshwater, NP  glimepiride (AMARYL) 2 MG tablet Take 1 tablet (2 mg total) by mouth 2 (two) times daily. 12/25/18   Ronnell Freshwater, NP  glucose blood (CONTOUR NEXT TEST) test strip Use as instructed three times a daily diag E11.65 12/26/18   Ronnell Freshwater, NP  glucose blood (CONTOUR TEST)  test strip 1 each by Other route 3 (three) times daily. diag e11.65 09/29/17   Ronnell Freshwater, NP  hydrALAZINE (APRESOLINE) 100 MG tablet Take 1 tablet (100 mg total) by mouth 3 (three) times daily. 10/03/18   Lavera Guise, MD  Insulin Glargine [facility]) 100 UNIT/ML SOPN Use 20-25 units with supper qd 12/18/18   Lavera Guise, MD  Insulin Pen Needle (BD PEN NEEDLE NANO U/F) 32G X 4 MM MISC Use as directed with insulin 01/01/19   Ronnell Freshwater, NP  levothyroxine (SYNTHROID) 137 MCG tablet Take 1 tablet (137 mcg total) by mouth daily before breakfast. 10/03/18   Lavera Guise, MD  mirtazapine (REMERON) 7.5 MG tablet Take one tab po qhs for insomnia 12/18/18   Lavera Guise, MD  Nebivolol HCl (BYSTOLIC) 20 MG TABS Take one tab po bid 10/03/18   Lavera Guise, MD  nystatin (MYCOSTATIN) 100000 UNIT/ML suspension Take 5 mLs (500,000 Units total) by mouth 4 (four) times daily. 09/29/17   Lavera Guise, MD  oxyCODONE (OXY IR/ROXICODONE) 5 MG immediate release tablet Take 1 tablet (5 mg total) by mouth every 4 (four) hours as needed for severe pain. 01/29/19   Kendell Bane, NP  pimecrolimus (ELIDEL) 1 % cream Apply topically 2 (two) times daily. 09/29/17   Lavera Guise, MD  Plecanatide (TRULANCE) 3 MG TABS Take 3 mg by mouth daily. 10/03/18   Lavera Guise, MD  rOPINIRole (REQUIP) 0.25 MG tablet Take 1 tablet (0.25 mg total) by mouth 2 (two) times daily. 12/25/18   Ronnell Freshwater, NP  saxagliptin HCl (ONGLYZA) 5 MG TABS tablet Take 1 tablet (5 mg total) by mouth daily. 10/03/18   Lavera Guise, MD  traMADol (ULTRAM) 50 MG tablet Take 1 tablet (50 mg total) by mouth 2  (two) times daily. 12/24/18   Ronnell Freshwater, NP  traZODone (DESYREL) 50 MG tablet Take one 2 tabs at night for sleep 09/29/17   Lavera Guise, MD  zolpidem (AMBIEN) 5 MG tablet Take 1 tablet (5 mg total) by mouth at bedtime. 10/03/18   Lavera Guise, MD   Physical Exam: Blood pressure (!) 177/64, pulse 79, temperature 97.7 F (36.5 C), temperature source Oral, resp. rate (!) 24, height 5' 4" (1.626 m), weight 72.6 kg, SpO2 91 %. 1. General:  in No Acute distress    Chronically ill  -appearing 2. Psychological: Alert and  Oriented 3. Head/ENT:   Moist  Mucous Membranes                          Head Non traumatic, neck supple                           Poor Dentition 4. SKIN: normal    Skin turgor,  Skin clean Dry and intact no rash 5. Heart: Regular rate and rhythm no  Murmur, no Rub or gallop 6. Lungs:   no wheezes or crackles   7. Abdomen: Soft, RUQ tenderness, hepatomegaly, Non distended obese bowel sounds present 8. Lower extremities: no clubbing, cyanosis, no  edema 9. Neurologically Grossly intact, moving all 4 extremities equally   10. MSK: Normal range of motion   All other LABS:     Recent Labs  Lab 01/30/19 1828  WBC 11.0*  HGB 12.1  HCT 39.1  MCV 85.7  PLT 339  Recent Labs  Lab 01/30/19 1828 01/30/19 2130  NA 134* 132*  K 4.1 5.1  CL 103 102  CO2 20* 20*  GLUCOSE 168* 260*  BUN 26* 27*  CREATININE 1.07* 1.23*  CALCIUM 8.9 8.6*     Recent Labs  Lab 01/30/19 2130  AST 18  ALT 15  ALKPHOS 77  BILITOT 0.7  PROT 7.4  ALBUMIN 3.0*       Cultures: No results found for: SDES, SPECREQUEST, CULT, REPTSTATUS   Radiological Exams on Admission: Ct Angio Chest Pe W Or Wo Contrast  Result Date: 01/30/2019 CLINICAL DATA:  Shortness of breath EXAM: CT ANGIOGRAPHY CHEST WITH CONTRAST TECHNIQUE: Multidetector CT imaging of the chest was performed using the standard protocol during bolus administration of intravenous contrast. Multiplanar CT image  reconstructions and MIPs were obtained to evaluate the vascular anatomy. CONTRAST:  39m OMNIPAQUE IOHEXOL 350 MG/ML SOLN COMPARISON:  12/16/2015 FINDINGS: Cardiovascular: Evaluation for pulmonary emboli is limited by respiratory motion artifact.Given this limitation, no PE was identified. The main pulmonary artery is within normal limits for size. There is no CT evidence of acute right heart strain. There are advanced atherosclerotic changes throughout the thoracic aorta, especially the descending aorta where there is mixed calcified and noncalcified plaque. Heart size is enlarged. There is no significant pericardial effusion. Coronary artery calcifications are noted. Mediastinum/Nodes: --again noted are enlarged mediastinal and hilar lymph nodes. These appear to be relatively stable in size when compared to prior study with slight interval increase of several lymph nodes from 2017. --No axillary lymphadenopathy. --No supraclavicular lymphadenopathy. --Normal thyroid gland. --The esophagus is unremarkable Lungs/Pleura: There is a moderate-sized right-sided pleural effusion. There is a small left-sided pleural effusion. There is significant atelectasis involving both lower lobes. This results in partial collapse of the bilateral lower lobes. The lung volumes are low. There is a right infrahilar 2.7 cm hypoattenuating area. There is no pneumothorax. Upper Abdomen: No acute abnormality. Musculoskeletal: No chest wall abnormality. No acute or significant osseous findings. Review of the MIP images confirms the above findings. IMPRESSION: 1. Evaluation for pulmonary emboli is limited by respiratory motion artifact. Given this limitation, no PE was identified. 2. Cardiomegaly with coronary artery disease. 3. Bilateral pleural effusions, right greater than left, with associated partial collapse of the bilateral lower lobes. 4. There is a 2.7 cm hypoattenuating area within the right infrahilar region. This may represent a  small area of consolidation, however, a mass is not excluded. Follow-up is recommended. 5. Stable mediastinal and hilar lymphadenopathy, likely reactive. 6. Aortic atherosclerosis. Aortic Atherosclerosis (ICD10-I70.0). Electronically Signed   By: CConstance HolsterM.D.   On: 01/30/2019 22:10   Dg Chest Portable 1 View  Result Date: 01/30/2019 CLINICAL DATA:  Shortness of breath. Cough. EXAM: PORTABLE CHEST 1 VIEW COMPARISON:  Chest CT 12/16/2015 FINDINGS: Low lung volumes. Borderline cardiomegaly. There are bilateral pleural effusions, left greater than right, with adjacent compressive atelectasis. Peribronchial and interstitial thickening most consistent pulmonary edema. No pneumothorax. No acute osseous abnormalities are seen. IMPRESSION: Diffuse interstitial and bronchial thickening suspicious for pulmonary edema. Small bilateral pleural effusions with borderline cardiomegaly. Overall findings are most consistent with CHF. Aortic Atherosclerosis (ICD10-I70.0). Electronically Signed   By: MKeith RakeM.D.   On: 01/30/2019 18:59    Chart has been reviewed    Assessment/Plan  78y.o. [place] with medical history significant of left 2 diabetes uncontrolled CAD depression, B12 deficiency, hypertension, hypothyroidism out of hearing  Admitted  for hypoxia in the setting of bilateral pleural effusions thought to be possibly CHF exacerbation  Present on Admission: Fluid overload -obtain echogram to evaluate for evidence of CHF given elevated BNP and chest x-ray initially was worrisome for fluid overload evidence of bilateral pleural effusions unclear etiology. Lasix given in the emergency department with poor response Will obtain bladder scan to evaluate if any evidence of urinary retention If echogram shows evidence of heart failure would recommend cardiology consult   Essential hypertension stable continue home medications   CAD (coronary  artery disease) -so far no evidence of chest pain no EKG changes troponin unremarkable   CKD (chronic kidney disease), stage II avoid nephrotoxic medications currently at baseline   Asthma -chronic mild nonpersistent administer albuterol as needed continue home medications   DM (diabetes mellitus), type 2 with complications ([place])-  - Order Sensitive  SSI   -  switch to   Lantus 12 units,  -  check TSH and HgA1C  - Hold by mouth medications     Pleural effusions -abnormal CT of the chest no evidence of PE but bilateral pleural effusions noted   unclear etiology if seems to not respond to Lasix will most likely benefit from thoracentesis and further assessment if patient agrees. Would recommend reassessing tomorrow  Possible community-acquired pneumonia chest CT could not rule out a possible consolidation.  Patient with elevated white blood cell count no fevers For right now would cover with Rocephin and azithromycin and reassess in the morning Would likely benefit from thoracentesis if patient and family agreeable  Other plan as per orders.  DVT prophylaxis:    Lovenox     Code Status:    DNR/DNI   as per patient  I had personally discussed CODE STATUS with  Family     Family Communication:   Family   at  Bedside  plan of care was discussed  with   Daughter   Disposition Plan:      To home once workup is complete and patient is stable                    Would benefit from PT/OT eval prior to [place] called:   None Please consult cardiology if abnormal echogram  Admission status:  ED Disposition    ED Disposition Condition [place]: [place] [[id number]]  Level of Care: Stepdown [14]  Covid Evaluation: Asymptomatic Screening Protocol (No Symptoms)  Diagnosis: CHF exacerbation [facility]) [[id number]]  Admitting Physician: Toy Baker [[id number]]  Attending Physician: Toy Baker [[id number]]  Estimated length of stay: 3 - 4 days  Certification:: I certify this patient will need inpatient services for at least 2 midnights  PT Class (Do Not Modify): Inpatient [101]  PT Acc Code (Do Not Modify): Private [1]         inpatient     Expect 2 midnight stay secondary to severity of patient's current illness including   hemodynamic instability despite optimal treatment ( hypoxia,  )   Severe lab/radiological/exam abnormalities including:    CHF on CXR  and extensive comorbidities including:   DM2     CAD  COPD/asthma  CKD   That are currently affecting medical management.  I expect  patient to be hospitalized for 2 midnights requiring inpatient medical care.  Patient is at high risk for adverse outcome (such as loss of life or disability) if not treated.  Indication for inpatient stay as follows:  Severe change from baseline regarding mental status Hemodynamic instability despite maximal medical therapy,    New or worsening hypoxia  Need for IV  lasix    Level of care   SDU tele indefinitely please discontinue once patient no longer qualifies  Precautions:  NONE   No active isolations  PPE: Used by the provider:   P100  eye Goggles,  Gloves     Callen Vancuren 01/30/2019, 9:14 PM    [facility]     after 2 AM please page floor coverage PA If 7AM-7PM, please contact the day team taking care of the patient using [email or web address]

## 2019-01-30 NOTE — ED Triage Notes (Addendum)
Pt arrives from home via [facility] for SOB. Was 80% on RA, came up to 90% on 6 L [place]. Received 1 duoneb with EMS. Coughing and vomiting upon arrival. 167/84 with EMS. CBG 157.   Pt does NOT speak any [place], family member with pt to translate.

## 2019-01-30 NOTE — ED Notes (Signed)
Patient transported to ICU.

## 2019-01-30 NOTE — ED Notes (Signed)
ICU requesting patient not be transported until they call and tell ED it is ok to transport

## 2019-01-31 ENCOUNTER — Inpatient Hospital Stay (HOSPITAL_COMMUNITY)
Admit: 2019-01-31 | Discharge: 2019-01-31 | Disposition: A | Discharge status: Home / Self Care | Payer: Medicare Other | Attending: Internal Medicine | Admitting: Internal Medicine

## 2019-01-31 ENCOUNTER — Other Ambulatory Visit: Payer: Self-pay

## 2019-01-31 ENCOUNTER — Inpatient Hospital Stay: Payer: Medicare Other

## 2019-01-31 DIAGNOSIS — R079 Chest pain, unspecified: Secondary | ICD-10-CM

## 2019-01-31 DIAGNOSIS — J90 Pleural effusion, not elsewhere classified: Secondary | ICD-10-CM | POA: Diagnosis present

## 2019-01-31 DIAGNOSIS — I361 Nonrheumatic tricuspid (valve) insufficiency: Secondary | ICD-10-CM

## 2019-01-31 DIAGNOSIS — I340 Nonrheumatic mitral (valve) insufficiency: Secondary | ICD-10-CM

## 2019-01-31 DIAGNOSIS — J189 Pneumonia, unspecified organism: Secondary | ICD-10-CM | POA: Diagnosis present

## 2019-01-31 LAB — COMPREHENSIVE METABOLIC PANEL
ALT: 15 U/L (ref 0–44)
AST: 9 U/L — ABNORMAL LOW (ref 15–41)
Albumin: 3.3 g/dL — ABNORMAL LOW (ref 3.5–5.0)
Alkaline Phosphatase: 80 U/L (ref 38–126)
Anion gap: 11 (ref 5–15)
BUN: 29 mg/dL — ABNORMAL HIGH (ref 8–23)
CO2: 21 mmol/L — ABNORMAL LOW (ref 22–32)
Calcium: 8.8 mg/dL — ABNORMAL LOW (ref 8.9–10.3)
Chloride: 99 mmol/L (ref 98–111)
Creatinine, Ser: 1.24 mg/dL — ABNORMAL HIGH (ref 0.44–1.00)
GFR calc Af Amer: 48 mL/min — ABNORMAL LOW (ref >60)
GFR calc non Af Amer: 41 mL/min — ABNORMAL LOW (ref >60)
Glucose, Bld: 304 mg/dL — ABNORMAL HIGH (ref 70–99)
Potassium: 4.7 mmol/L (ref 3.5–5.1)
Sodium: 131 mmol/L — ABNORMAL LOW (ref 135–145)
Total Bilirubin: 0.9 mg/dL (ref 0.3–1.2)
Total Protein: 7.7 g/dL (ref 6.5–8.1)

## 2019-01-31 LAB — GLUCOSE, CAPILLARY
Glucose-Capillary: 135 mg/dL — ABNORMAL HIGH (ref 70–99)
Glucose-Capillary: 141 mg/dL — ABNORMAL HIGH (ref 70–99)
Glucose-Capillary: 231 mg/dL — ABNORMAL HIGH (ref 70–99)
Glucose-Capillary: 90 mg/dL (ref 70–99)
Glucose-Capillary: 95 mg/dL (ref 70–99)

## 2019-01-31 LAB — CBC
HCT: 37.7 % (ref 36.0–46.0)
Hemoglobin: 11.7 g/dL — ABNORMAL LOW (ref 12.0–15.0)
MCH: 26.6 pg (ref 26.0–34.0)
MCHC: 31 g/dL (ref 30.0–36.0)
MCV: 85.7 fL (ref 80.0–100.0)
Platelets: 319 10*3/uL (ref 150–400)
RBC: 4.4 MIL/uL (ref 3.87–5.11)
RDW: 13.7 % (ref 11.5–15.5)
WBC: 7.9 10*3/uL (ref 4.0–10.5)
nRBC: 0 % (ref 0.0–0.2)

## 2019-01-31 LAB — PHOSPHORUS: Phosphorus: 5.1 mg/dL — ABNORMAL HIGH (ref 2.5–4.6)

## 2019-01-31 LAB — MAGNESIUM: Magnesium: 1.8 mg/dL (ref 1.7–2.4)

## 2019-01-31 LAB — ECHOCARDIOGRAM COMPLETE
Height: 64 in
Weight: 2574.97 oz

## 2019-01-31 LAB — TSH: TSH: 3.678 u[IU]/mL (ref 0.350–4.500)

## 2019-01-31 LAB — HEMOGLOBIN A1C
Hgb A1c MFr Bld: 7.9 % — ABNORMAL HIGH (ref 4.8–5.6)
Mean Plasma Glucose: 180.03 mg/dL

## 2019-01-31 LAB — TROPONIN I (HIGH SENSITIVITY)
Troponin I (High Sensitivity): 14 ng/L (ref <18)
Troponin I (High Sensitivity): 14 ng/L (ref <18)

## 2019-01-31 MED ORDER — AEROCHAMBER PLUS FLO-VU MISC
1.0000 | Freq: Once | Status: DC
  Filled 2019-01-31 (×2): qty 1

## 2019-01-31 MED ORDER — SODIUM CHLORIDE 0.9 % IV SOLN
500.0000 mg | INTRAVENOUS | Status: DC
  Administered 2019-01-31 – 2019-02-01 (×2): 500 mg via INTRAVENOUS
  Filled 2019-01-31 (×3): qty 500

## 2019-01-31 MED ORDER — FUROSEMIDE 10 MG/ML IJ SOLN
40.0000 mg | Freq: Every day | INTRAMUSCULAR | Status: DC
  Administered 2019-01-31: 40 mg via INTRAVENOUS
  Filled 2019-01-31: qty 4

## 2019-01-31 MED ORDER — CHLORHEXIDINE GLUCONATE CLOTH 2 % EX PADS: 6.0000 | MEDICATED_PAD | Freq: Every day | CUTANEOUS | Status: DC

## 2019-01-31 MED ORDER — SODIUM CHLORIDE 0.9 % IV SOLN
2.0000 g | INTRAVENOUS | Status: DC
  Administered 2019-01-31 – 2019-02-01 (×2): 2 g via INTRAVENOUS
  Filled 2019-01-31 (×2): qty 2
  Filled 2019-01-31: qty 20

## 2019-01-31 MED ORDER — INSULIN GLARGINE 100 UNIT/ML ~~LOC~~ SOLN
12.0000 [IU] | Freq: Every day | SUBCUTANEOUS | Status: DC
  Administered 2019-01-31 (×2): 12 [IU] via SUBCUTANEOUS
  Filled 2019-01-31 (×3): qty 0.12

## 2019-01-31 MED ORDER — INSULIN GLARGINE 100 UNIT/ML ~~LOC~~ SOLN: 20.0000 [IU] | Freq: Every day | SUBCUTANEOUS | Status: DC

## 2019-01-31 NOTE — Evaluation (Signed)
Physical Therapy Evaluation Patient Details Name: Evelyn Stark MRN: [id number] DOB: 1940-09-27 Today's Date: 01/31/2019   History of Present Illness  Pt admitted for CHF exacerbation with complaints of SOB symptoms. History includes DM, CAD, depression, and HTN. Pt from [place] and came to [place] to visit family, became sick and admitted to [facility]. Phone interpreter attempted, however none available for Urdu language. No family in room. Called daughter (number in room), however no answer.  Clinical Impression  Pt is a pleasant 78 year old female who was admitted for CHF exacerbation. RN in room to assist therapist with language line, however no interp available right now. Daughter unavailable as well. Evaluation limited due to language barrier. Pt performs bed mobility, transfers, and ambulation with cga with HHA. Pt demonstrates deficits with strength/endurance. Will attempt further evaluation with interpreter available. Would benefit from skilled PT to address above deficits and promote optimal return to PLOF. Recommend transition to [facility] upon discharge from acute hospitalization.     Follow Up Recommendations Home health PT;Supervision/Assistance - 24 hour    Equipment Recommendations  (TBD)    Recommendations for Other Services       Precautions / Restrictions Precautions Precautions: Fall Restrictions Weight Bearing Restrictions: No      Mobility  Bed Mobility Overal bed mobility: Needs Assistance Bed Mobility: Supine to Sit     Supine to sit: Min guard     General bed mobility comments: using hand gestures to communicate, pt able to sit at EOB  Transfers Overall transfer level: Needs assistance Equipment used: 1 person hand held assist Transfers: Sit to/from Stand Sit to Stand: Min guard         General transfer comment: safe technique with ability to come to upright standing with HHA.  Ambulation/Gait Ambulation/Gait assistance: Min guard Gait Distance (Feet): 3  Feet Assistive device: 1 person hand held assist Gait Pattern/deviations: Step-to pattern     General Gait Details: able to ambulate to recliner to sit up for lunch. All mobility performed on 4L of O2 with sats at 91%. SLight SOB symptoms noted with exertion. Further distance deferred for safety  Stairs            Wheelchair Mobility    Modified Rankin (Stroke Patients Only)       Balance Overall balance assessment: Mild deficits observed, not formally tested                                           Pertinent Vitals/Pain Pain Assessment: No/denies pain    Home Living Family/patient expects to be discharged to:: Private residence Living Arrangements: (family)               Additional Comments: due to langauage barrier, unable to obtain living situation. [place] residence appears temporary per chart review.    Prior Function           Comments: uncertain due to language barrier     Hand Dominance        Extremity/Trunk Assessment   Upper Extremity Assessment Upper Extremity Assessment: Generalized weakness(B UE grossly 4/5 observed by function)    Lower Extremity Assessment Lower Extremity Assessment: Generalized weakness(B LE grossly 4/5 observed by function)       Communication   Communication: Prefers language other than English(no interp available)  Cognition Arousal/Alertness: Awake/alert Behavior During Therapy: WFL for tasks assessed/performed Overall Cognitive Status: No  family/caregiver present to determine baseline cognitive functioning                                        General Comments      Exercises     Assessment/Plan    PT Assessment Patient needs continued PT services  PT Problem List Decreased strength;Decreased activity tolerance;Decreased balance;Decreased mobility;Cardiopulmonary status limiting activity       PT Treatment Interventions Gait training;DME instruction;Therapeutic  activities;Therapeutic exercise;Balance training    PT Goals (Current goals can be found in the Care Plan section)  Acute Rehab PT Goals Patient Stated Goal: unable to state PT Goal Formulation: Patient unable to participate in goal setting Time For Goal Achievement: 02/14/19 Potential to Achieve Goals: Fair    Frequency Min 2X/week   Barriers to discharge        Co-evaluation               AM-PAC PT "6 Clicks" Mobility  Outcome Measure Help needed turning from your back to your side while in a flat bed without using bedrails?: A Little Help needed moving from lying on your back to sitting on the side of a flat bed without using bedrails?: A Little Help needed moving to and from a bed to a chair (including a wheelchair)?: A Little Help needed standing up from a chair using your arms (e.g., wheelchair or bedside chair)?: A Little Help needed to walk in hospital room?: A Little Help needed climbing 3-5 steps with a railing? : A Lot 6 Click Score: 17    End of Session Equipment Utilized During Treatment: Oxygen Activity Tolerance: Patient tolerated treatment well Patient left: in chair Nurse Communication: Mobility status PT Visit Diagnosis: Unsteadiness on feet (R26.81);Muscle weakness (generalized) (M62.81);Difficulty in walking, not elsewhere classified (R26.2)    Time: 5956-3875 PT Time Calculation (min) (ACUTE ONLY): 17 min   Charges:   PT Evaluation $PT Eval Moderate Complexity: 1 [address] PT, DPT [phone]   Oryn Casanova 01/31/2019, 2:18 PM

## 2019-01-31 NOTE — Progress Notes (Signed)
Phys tx here. Pt got up with therapist to chair at bedside.

## 2019-01-31 NOTE — Progress Notes (Signed)
Still awaiting echo report.

## 2019-01-31 NOTE — Progress Notes (Addendum)
PROGRESS NOTE    Evelyn Stark  YQM:[id number] DOB: 05/19/1940 DOA: 01/30/2019 PCP: Carlean Jews, NP    Brief Narrative:  Evelyn Stark is a 78 y.o. female originally from [place] does not speak English with medical history significant of left 2 diabetes uncontrolled CAD, depression, B12 deficiency, hypertension, hypothyroidism  presented with   significant shortness of breath.  EMS was only 80% room air improved up to 90% on 6 L patient received DuoNeb with EMS continued to cough had some weezing she used her inhaler but got worse She was lightheaded with using the bathroom Family checked her pulse ox it was in the 70% They tried to use home concentrator and she got better her oxygen concentration went up to 89% on 3L Family decided to call EMS She not on o2 at baseline they had it from prior hospitalization She traveled from [place] 1st week of October No fever at home No N/V/D Family reported chest tightness She has no known hx of CHF      Consultants:  CTA 11/25 1. Evaluation for pulmonary emboli is limited by respiratory motion artifact. Given this limitation, no PE was identified. 2. Cardiomegaly with coronary artery disease. 3. Bilateral pleural effusions, right greater than left, with associated partial collapse of the bilateral lower lobes. 4. There is a 2.7 cm hypoattenuating area within the right infrahilar region. This may represent a small area of consolidation, however, a mass is not excluded. Follow-up is recommended. 5. Stable mediastinal and hilar lymphadenopathy, likely reactive. 6. Aortic atherosclerosis. Aortic Atherosclerosis    CXR: Diffuse interstitial and bronchial thickening suspicious for pulmonary edema. Small bilateral pleural effusions with borderline cardiomegaly. Overall findings are most consistent with CHF. Aortic Atherosclerosis .  Procedures: none  Antimicrobials:   azithro and ceftrixone   Subjective: Pt sitting up in bed.  Reports breathing better. Denies cp.   Objective: Vitals:   01/31/19 0600 01/31/19 0800 01/31/19 1000 01/31/19 1100  BP: (!) 147/52  (!) 164/65 (!) 158/57  Pulse: 67  73 71  Resp: 17  (!) 28 13  Temp:  98 F (36.7 C)  98.2 F (36.8 C)  TempSrc:  Oral    SpO2: 93%  92% 91%  Weight:      Height:        Intake/Output Summary (Last 24 hours) at 01/31/2019 1318 Last data filed at 01/31/2019 0940 Gross per 24 hour  Intake 120 ml  Output 1100 ml  Net -980 ml   Filed Weights   01/30/19 1830 01/31/19 0500  Weight: 72.6 kg 73 kg    Examination:  General exam: Appears calm and comfortable , sitting up in chair, nad Respiratory system: CTA with decrease bs at bases, no r/r/w Cardiovascular system: S1 & S2 heard, RRR. No m/r/g Gastrointestinal system: Soft nt/nd +bs Central nervous system: Alert and oriented. Grossly intact Extremities: No edema cyanosis Skin: Warm and dry Psychiatric; Mood & affect appropriate for current setting.     Data Reviewed: I have personally reviewed following labs and imaging studies  CBC: Recent Labs  Lab 01/30/19 1828 01/31/19 0146  WBC 11.0* 7.9  HGB 12.1 11.7*  HCT 39.1 37.7  MCV 85.7 85.7  PLT 339 319   Basic Metabolic Panel: Recent Labs  Lab 01/30/19 1828 01/30/19 2130 01/31/19 0146  NA 134* 132* 131*  K 4.1 5.1 4.7  CL 103 102 99  CO2 20* 20* 21*  GLUCOSE 168* 260* 304*  BUN 26* 27* 29*  CREATININE 1.07* 1.23* 1.24*  CALCIUM 8.9 8.6* 8.8*  MG  --   --  1.8  PHOS  --   --  5.1*   GFR: Estimated Creatinine Clearance: 35.4 mL/min (A) (by C-G formula based on SCr of 1.24 mg/dL (H)). Liver Function Tests: Recent Labs  Lab 01/30/19 2130 01/31/19 0146  AST 18 9*  ALT 15 15  ALKPHOS 77 80  BILITOT 0.7 0.9  PROT 7.4 7.7  ALBUMIN 3.0* 3.3*   No results for input(s): LIPASE, AMYLASE in the last 168 hours. No results for input(s): AMMONIA in the last 168 hours. Coagulation Profile: No results for input(s): INR, PROTIME  in the last 168 hours. Cardiac Enzymes: No results for input(s): CKTOTAL, CKMB, CKMBINDEX, TROPONINI in the last 168 hours. BNP (last 3 results) No results for input(s): PROBNP in the last 8760 hours. HbA1C: Recent Labs    01/31/19 0146  HGBA1C 7.9*   CBG: Recent Labs  Lab 01/30/19 2334 01/31/19 0341 01/31/19 0745 01/31/19 1129  GLUCAP 288* 231* 95 141*   Lipid Profile: No results for input(s): CHOL, HDL, LDLCALC, TRIG, CHOLHDL, LDLDIRECT in the last 72 hours. Thyroid Function Tests: Recent Labs    01/31/19 0146  TSH 3.678   Anemia Panel: No results for input(s): VITAMINB12, FOLATE, FERRITIN, TIBC, IRON, RETICCTPCT in the last 72 hours. Sepsis Labs: No results for input(s): PROCALCITON, LATICACIDVEN in the last 168 hours.  Recent Results (from the past 240 hour(s))  SARS Coronavirus 2 by RT PCR (hospital order, performed in [facility] hospital lab) Nasopharyngeal Nasopharyngeal Swab     Status: None   Collection Time: 01/30/19  8:05 PM   Specimen: Nasopharyngeal Swab  Result Value Ref Range Status   SARS Coronavirus 2 NEGATIVE NEGATIVE Final    Comment: (NOTE) SARS-CoV-2 target nucleic acids are NOT DETECTED. The SARS-CoV-2 RNA is generally detectable in upper and lower respiratory specimens during the acute phase of infection. The lowest concentration of SARS-CoV-2 viral copies this assay can detect is 250 copies / mL. A negative result does not preclude SARS-CoV-2 infection and should not be used as the sole basis for treatment or other patient management decisions.  A negative result may occur with improper specimen collection / handling, submission of specimen other than nasopharyngeal swab, presence of viral mutation(s) within the areas targeted by this assay, and inadequate number of viral copies (<250 copies / mL). A negative result must be combined with clinical observations, patient history, and epidemiological information. Fact Sheet for Patients:     BoilerBrush.com.cy Fact Sheet for Healthcare Providers: https://pope.com/ This test is not yet approved or cleared  by the [place] FDA and has been authorized for detection and/or diagnosis of SARS-CoV-2 by FDA under an Emergency Use Authorization (EUA).  This EUA will remain in effect (meaning this test can be used) for the duration of the COVID-19 declaration under Section 564(b)(1) of the Act, 21 U.S.C. section 360bbb-3(b)(1), unless the authorization is terminated or revoked sooner. Performed at [facility], [address] [place], [place] [address]          Radiology Studies: Ct Angio Chest Pe W Or Wo Contrast  Result Date: 01/30/2019 CLINICAL DATA:  Shortness of breath EXAM: CT ANGIOGRAPHY CHEST WITH CONTRAST TECHNIQUE: Multidetector CT imaging of the chest was performed using the standard protocol during bolus administration of intravenous contrast. Multiplanar CT image reconstructions and MIPs were obtained to evaluate the vascular anatomy. CONTRAST:  75mL OMNIPAQUE IOHEXOL 350 MG/ML SOLN COMPARISON:  12/16/2015 FINDINGS: Cardiovascular: Evaluation for  pulmonary emboli is limited by respiratory motion artifact.Given this limitation, no PE was identified. The main pulmonary artery is within normal limits for size. There is no CT evidence of acute right heart strain. There are advanced atherosclerotic changes throughout the thoracic aorta, especially the descending aorta where there is mixed calcified and noncalcified plaque. Heart size is enlarged. There is no significant pericardial effusion. Coronary artery calcifications are noted. Mediastinum/Nodes: --again noted are enlarged mediastinal and hilar lymph nodes. These appear to be relatively stable in size when compared to prior study with slight interval increase of several lymph nodes from 2017. --No axillary lymphadenopathy. --No supraclavicular lymphadenopathy.  --Normal thyroid gland. --The esophagus is unremarkable Lungs/Pleura: There is a moderate-sized right-sided pleural effusion. There is a small left-sided pleural effusion. There is significant atelectasis involving both lower lobes. This results in partial collapse of the bilateral lower lobes. The lung volumes are low. There is a right infrahilar 2.7 cm hypoattenuating area. There is no pneumothorax. Upper Abdomen: No acute abnormality. Musculoskeletal: No chest wall abnormality. No acute or significant osseous findings. Review of the MIP images confirms the above findings. IMPRESSION: 1. Evaluation for pulmonary emboli is limited by respiratory motion artifact. Given this limitation, no PE was identified. 2. Cardiomegaly with coronary artery disease. 3. Bilateral pleural effusions, right greater than left, with associated partial collapse of the bilateral lower lobes. 4. There is a 2.7 cm hypoattenuating area within the right infrahilar region. This may represent a small area of consolidation, however, a mass is not excluded. Follow-up is recommended. 5. Stable mediastinal and hilar lymphadenopathy, likely reactive. 6. Aortic atherosclerosis. Aortic Atherosclerosis (ICD10-I70.0). Electronically Signed   By: Katherine Mantlehristopher  Green M.D.   On: 01/30/2019 22:10   Dg Chest Portable 1 View  Result Date: 01/30/2019 CLINICAL DATA:  Shortness of breath. Cough. EXAM: PORTABLE CHEST 1 VIEW COMPARISON:  Chest CT 12/16/2015 FINDINGS: Low lung volumes. Borderline cardiomegaly. There are bilateral pleural effusions, left greater than right, with adjacent compressive atelectasis. Peribronchial and interstitial thickening most consistent pulmonary edema. No pneumothorax. No acute osseous abnormalities are seen. IMPRESSION: Diffuse interstitial and bronchial thickening suspicious for pulmonary edema. Small bilateral pleural effusions with borderline cardiomegaly. Overall findings are most consistent with CHF. Aortic  Atherosclerosis (ICD10-I70.0). Electronically Signed   By: Narda RutherfordMelanie  Sanford M.D.   On: 01/30/2019 18:59   [place] Abdomen Limited Ruq  Result Date: 01/31/2019 CLINICAL DATA:  Hepatomegaly. EXAM: ULTRASOUND ABDOMEN LIMITED RIGHT UPPER QUADRANT COMPARISON:  None. FINDINGS: Gallbladder: Previous cholecystectomy. Common bile duct: Diameter: 4.5 mm. Liver: Mild increased parenchymal echogenicity compatible with steatosis without focal mass. Portal vein is patent on color Doppler imaging with normal direction of blood flow towards the liver. Other: Possible small amount right pleural fluid. IMPRESSION: 1.  Previous cholecystectomy.  No acute hepatobiliary disease. 2.  Mild hepatic steatosis without focal mass. 3.  Small right pleural effusion. Electronically Signed   By: Elberta Fortisaniel  Boyle M.D.   On: 01/31/2019 09:54        Scheduled Meds:  aerochamber plus with mask  1 each Other Once   aspirin EC  81 mg Oral Daily   atorvastatin  20 mg Oral q1800   Chlorhexidine Gluconate Cloth  6 each Topical Daily   enoxaparin (LOVENOX) injection  40 mg Subcutaneous Q24H   escitalopram  20 mg Oral QHS   furosemide  40 mg Intravenous Daily   gabapentin  100 mg Oral TID   insulin aspart  0-9 Units Subcutaneous Q4H   insulin glargine  12  Units Subcutaneous QHS   levothyroxine  137 mcg Oral QAC breakfast   mometasone-formoterol  2 puff Inhalation BID   nebivolol  20 mg Oral Daily   pantoprazole  40 mg Oral Daily   rOPINIRole  0.25 mg Oral BID   sodium chloride flush  3 mL Intravenous Q12H   Continuous Infusions:  sodium chloride 250 mL (01/31/19 0152)   azithromycin 500 mg (01/31/19 0242)   cefTRIAXone (ROCEPHIN)  IV 2 g (01/31/19 0154)    Assessment & Plan:   Active Problems:   CHF exacerbation (HCC)   Essential hypertension   CAD (coronary artery disease)   CKD (chronic kidney disease), stage III   Asthma   DM (diabetes mellitus), type 2 with complications (HCC)   Pleural effusion    CAP (community acquired pneumonia)  Pt is a 78 y.o. female originally from [place],  with PMHx of  DM,   CAD, depression, B12 deficiency, hypertension, hypothyroidism admitted for hypoxia.  Hypoxia -in setting of CHF and pleural effusion  BNP elevated, chest x-ray consistent with pleural effusion and CHF.  Echo pending Was given Lasix 80 mg IV x1 and another Lasix 40 mg daily.  Currently clinically appears more euvolemic.   We will hold further Lasix given as sodium level has dropped. Will reevaluate in am and give prn Cardiology consulted Check a.m. labs    Essential hypertension- stable  -continue home medications   CAD - Cp free. TP negative. On statin,asa,and beta blk    CKD- stage II avoid nephrotoxic medications currently at baseline Monitor labs closely   Asthma -chronic mild nonpersistent administer albuterol as needed continue home medications   DM (diabetes mellitus), type 2 with complications (HCC)-  - Order Sensitive  SSI   -  switched to Lantus 12 units,  -  check TSH and HgA1C  - Hold oral meds -ck fss    Pleural effusions -likely 2/2 chf.    -Presumed community-acquired pneumonia chest CT could not rule out a possible consolidation.   Had leukocytosis, but afebrile. Leukocytosis certainly can be reactive/stress-induced  -For now we will continue Rocephin and azithromycin  Other plan as per orders.  DVT prophylaxis:    Lovenox    Code Status:    DNR/DNI   as per patient  Family Communication: none at bedside Disposition Plan: will likely be here 1-2 more days until medically stable.        LOS: 1 day   Time spent: 45 minutes with more than 50% on [place], MD [facility] Pager [phone]  If 7PM-7AM, please contact night-coverage [email or web address] Password [email or web address] 01/31/2019, 1:18 PM

## 2019-01-31 NOTE — Progress Notes (Signed)
*  PRELIMINARY RESULTS* Echocardiogram 2D Echocardiogram has been performed.  Evelyn Stark 01/31/2019, 10:32 AM

## 2019-02-01 ENCOUNTER — Inpatient Hospital Stay: Payer: Medicare Other

## 2019-02-01 DIAGNOSIS — I25118 Atherosclerotic heart disease of native coronary artery with other forms of angina pectoris: Secondary | ICD-10-CM

## 2019-02-01 DIAGNOSIS — J90 Pleural effusion, not elsewhere classified: Secondary | ICD-10-CM

## 2019-02-01 DIAGNOSIS — R0602 Shortness of breath: Secondary | ICD-10-CM

## 2019-02-01 DIAGNOSIS — E118 Type 2 diabetes mellitus with unspecified complications: Secondary | ICD-10-CM

## 2019-02-01 DIAGNOSIS — I509 Heart failure, unspecified: Secondary | ICD-10-CM

## 2019-02-01 LAB — BASIC METABOLIC PANEL
Anion gap: 8 (ref 5–15)
BUN: 27 mg/dL — ABNORMAL HIGH (ref 8–23)
CO2: 26 mmol/L (ref 22–32)
Calcium: 8.7 mg/dL — ABNORMAL LOW (ref 8.9–10.3)
Chloride: 103 mmol/L (ref 98–111)
Creatinine, Ser: 1.31 mg/dL — ABNORMAL HIGH (ref 0.44–1.00)
GFR calc Af Amer: 44 mL/min — ABNORMAL LOW (ref >60)
GFR calc non Af Amer: 38 mL/min — ABNORMAL LOW (ref >60)
Glucose, Bld: 97 mg/dL (ref 70–99)
Potassium: 4.2 mmol/L (ref 3.5–5.1)
Sodium: 137 mmol/L (ref 135–145)

## 2019-02-01 LAB — ALBUMIN, PLEURAL OR PERITONEAL FLUID: Albumin, Fluid: 1.5 g/dL

## 2019-02-01 LAB — LACTATE DEHYDROGENASE, PLEURAL OR PERITONEAL FLUID: LD, Fluid: 64 U/L — ABNORMAL HIGH (ref 3–23)

## 2019-02-01 LAB — GLUCOSE, CAPILLARY
Glucose-Capillary: 97 mg/dL (ref 70–99)
Glucose-Capillary: 107 mg/dL — ABNORMAL HIGH (ref 70–99)
Glucose-Capillary: 230 mg/dL — ABNORMAL HIGH (ref 70–99)
Glucose-Capillary: 89 mg/dL (ref 70–99)

## 2019-02-01 LAB — BRAIN NATRIURETIC PEPTIDE: B Natriuretic Peptide: 709 pg/mL — ABNORMAL HIGH (ref 0.0–100.0)

## 2019-02-01 LAB — PROTEIN, PLEURAL OR PERITONEAL FLUID: Total protein, fluid: <3 g/dL

## 2019-02-01 LAB — BODY FLUID CELL COUNT WITH DIFFERENTIAL
Eos, Fluid: 0 %
Lymphs, Fluid: 54 %
Monocyte-Macrophage-Serous Fluid: 36 %
Neutrophil Count, Fluid: 10 %
Total Nucleated Cell Count, Fluid: 43 cu mm

## 2019-02-01 LAB — AMYLASE, PLEURAL OR PERITONEAL FLUID: Amylase, Fluid: 5 U/L

## 2019-02-01 LAB — GLUCOSE, PLEURAL OR PERITONEAL FLUID: Glucose, Fluid: 187 mg/dL

## 2019-02-01 MED ORDER — AMOXICILLIN-POT CLAVULANATE 875-125 MG PO TABS: 1.0000 | ORAL_TABLET | Freq: Two times a day (BID) | ORAL | 0 refills | Status: DC

## 2019-02-01 MED ORDER — FUROSEMIDE 10 MG/ML IJ SOLN
20.0000 mg | Freq: Two times a day (BID) | INTRAMUSCULAR | Status: DC
  Administered 2019-02-01: 20 mg via INTRAVENOUS
  Filled 2019-02-01: qty 2

## 2019-02-01 MED ORDER — AZITHROMYCIN 500 MG PO TABS: 500.0000 mg | ORAL_TABLET | Freq: Every day | ORAL | 0 refills | Status: DC

## 2019-02-01 MED ORDER — ENOXAPARIN SODIUM 40 MG/0.4ML ~~LOC~~ SOLN: 40.0000 mg | SUBCUTANEOUS | Status: DC

## 2019-02-01 MED ORDER — AMOXICILLIN-POT CLAVULANATE 875-125 MG PO TABS
1.0000 | ORAL_TABLET | Freq: Two times a day (BID) | ORAL | Status: DC
  Administered 2019-02-01: 1 via ORAL
  Filled 2019-02-01 (×2): qty 1

## 2019-02-01 MED ORDER — AZITHROMYCIN 500 MG PO TABS: 500.0000 mg | ORAL_TABLET | Freq: Every day | ORAL | Status: DC

## 2019-02-01 NOTE — Progress Notes (Signed)
Patient discharged home with daughter. Discharged completed both with patient and interpreter alone and then again when daughter arrived. Patient has home O2 at home. No shortness of breath reported or pain during stay. All questions answered.

## 2019-02-01 NOTE — Progress Notes (Signed)
PHARMACIST - PHYSICIAN COMMUNICATION  CONCERNING: Antibiotic IV to Oral Route Change Policy  RECOMMENDATION: This patient is receiving azithromycin by the intravenous route.  Based on criteria approved by the Pharmacy and Therapeutics Committee, the antibiotic(s) is/are being converted to the equivalent oral dose form(s).   DESCRIPTION: These criteria include:  Patient being treated for a respiratory tract infection, urinary tract infection, cellulitis or clostridium difficile associated diarrhea if on metronidazole  The patient is not neutropenic and does not exhibit a GI malabsorption state  The patient is eating (either orally or via tube) and/or has been taking other orally administered medications for a least 24 hours  The patient is improving clinically and has a Tmax < 100.5  If you have questions about this conversion, please contact the Pharmacy Department at [phone].   MLS 11.27.20 1035

## 2019-02-01 NOTE — TOC Transition Note (Signed)
Transition of Care [facility]) - CM/SW Discharge Note   Patient Details  Name: Eudora Guevarra MRN: [id number] Date of Birth: 1941/02/17  Transition of [facility]) CM/SW Contact:  Su Hilt, RN Phone Number: 02/01/2019, 3:22 PM   Clinical Narrative:     Met with the patient and used the Interpreter electronically for Urdu.  I introduced myself to the patient and explained my role. She lives at home with her daughter and son in law that are both physicians She has DME at home and does not ned additional, she does weigh each day and has oxygen at home as well She said her daughter keeps a close  Eye on her CHF and she is not interested in a CHF program, she does not want or need HH services She has no needs at this time  Final next level of care: Home/Self Care Barriers to Discharge: Barriers Resolved   Patient Goals and CMS Choice Patient states their goals for this hospitalization and ongoing recovery are:: go home with family      Discharge Placement                       Discharge Plan and Services   Discharge Planning Services: CM Consult            DME Arranged: N/A         HH Arranged: NA          Social Determinants of Health (SDOH) Interventions     Readmission Risk Interventions No flowsheet data found.

## 2019-02-01 NOTE — Procedures (Signed)
Interventional Radiology Procedure:   Indications: Pleural effusions  Procedure: US guided thoracentesis  Findings: Removed 250 ml from right chest.  Complications: None     EBL: Less than 10 ml  Plan: Follow up CXR   Evelyn Stark R. Anselm Pancoast, MD  Pager: [phone]

## 2019-02-01 NOTE — Discharge Summary (Signed)
Evelyn Stark NGE:[id number] DOB: August 26, 1940 DOA: 01/30/2019  PCP: Ronnell Freshwater, NP  Admit date: 01/30/2019 Discharge date: 02/01/2019  Admitted From: Home Disposition: Home  Recommendations for Outpatient Follow-up:  1. Follow up with PCP in 1 week 2. Please obtain BMP/CBC in one week 3. Please follow up on the following pending results:thoracentesis results  Home Health: None   Discharge Condition:Stable CODE STATUS: DNR/DNI Diet recommendation: Carb Modified Brief/Interim Summary: Evelyn Stark a 78 y.o.femaleoriginally from [place] does not speak Englishwith medical history significant of left 2 diabetes uncontrolled CAD, depression, B12 deficiency, hypertension, hypothyroidism  presented withsignificant shortness of breath. EMS was only 80% room air improved up to 90% on 6 L patient received DuoNeb with EMS continued to cough had some weezing she used her inhaler but got worse She was lightheaded with using the bathroom Family checked her pulse ox it was in the 70% They tried to use home concentrator and she got better her oxygen concentration went up to 89% on 3L,Family decided to call EMS. She was admitted to the hospitalist service.  She was started on IV Lasix with good urine output.  An echocardiogram was obtained EF and normal PASP.  She was also started for presumed community-acquired pneumonia with ceftriaxone and azithromycin based on CT scan findings..  She was negative for Covid testing.  Was negative for PE.  Patient underwent thoracentesis with lab work ordered by IR today and post chest x-ray was negative for pneumothorax.  Cardiology was consulted they thought if symptoms persist or worsens they would consider right and left heart cath potentially even as outpatient for further evaluation.  Daughter states mother is feeling better and feels okay to discharge her home.  Patient also reveals feeling better and is stable to be discharged home.  They have home  oxygen at home.  They will need to follow-up with pulmonology next week.  Discharge Diagnoses:  Active Problems:   CHF exacerbation (HCC)   Essential hypertension   CAD (coronary artery disease)   CKD (chronic kidney disease), stage III   Asthma   DM (diabetes mellitus), type 2 with complications (HCC)   Pleural effusion   CAP (community acquired pneumonia)    Discharge Instructions  Discharge Instructions    Call MD for:  difficulty breathing, headache or visual disturbances   Complete by: As directed    Call MD for:  temperature >100.4   Complete by: As directed    Diet - low sodium heart healthy   Complete by: As directed    Discharge instructions   Complete by: As directed    Please follow up with pulmonology next week , have thoracentesis results by then.   Increase activity slowly   Complete by: As directed      Allergies as of 02/01/2019   No Known Allergies     Medication List    TAKE these medications   ALPRAZolam 0.25 MG tablet Commonly known as: XANAX Take 1 tablet (0.25 mg total) by mouth 2 (two) times daily as needed for sleep or anxiety.   amLODipine 10 MG tablet Commonly known as: NORVASC Take 1 tablet (10 mg total) by mouth 2 (two) times daily.   amoxicillin-clavulanate 875-125 MG tablet Commonly known as: AUGMENTIN Take 1 tablet by mouth every 12 (twelve) hours.   aspirin EC 81 MG tablet Take 81 mg by mouth daily.   atorvastatin 40 MG tablet Commonly known as: LIPITOR Take 0.5 tablets (20 mg total) by mouth daily.   azithromycin  500 MG tablet Commonly known as: ZITHROMAX Take 1 tablet (500 mg total) by mouth daily. Start taking on: February 02, 2019 What changed:   medication strength  how much to take  how to take this  when to take this  additional instructions   Basaglar KwikPen 100 UNIT/ML Sopn Use 20-25 units with supper qd What changed:   how much to take  how to take this  when to take this  additional  instructions   BD Pen Needle Nano U/F 32G X 4 MM Misc Generic drug: Insulin Pen Needle Use as directed with insulin   budesonide-formoterol 160-4.5 MCG/ACT inhaler Commonly known as: SYMBICORT Inhale 2 puffs into the lungs 2 (two) times daily as needed.   Bystolic 20 MG Tabs Generic drug: Nebivolol HCl Take one tab po bid What changed:   how much to take  how to take this  when to take this  additional instructions   canagliflozin 300 MG Tabs tablet Commonly known as: Invokana Take 1 tablet (300 mg total) by mouth daily before breakfast.   Contour Blood Glucose System w/Device Kit Use as directed diag E11.65   Contour Next Monitor w/Device Kit 1 each by Does not apply route daily. Use as directed Diag E11.65   escitalopram 20 MG tablet Commonly known as: LEXAPRO Take one tab po qd for depression   esomeprazole 40 MG capsule Commonly known as: NexIUM Take 1 capsule (40 mg total) by mouth daily.   gabapentin 100 MG capsule Commonly known as: NEURONTIN Take 1 capsule (100 mg total) by mouth 3 (three) times daily.   glimepiride 2 MG tablet Commonly known as: AMARYL Take 1 tablet (2 mg total) by mouth 2 (two) times daily.   glucose blood test strip Commonly known as: Contour Test 1 each by Other route 3 (three) times daily. diag e11.65   Contour Next Test test strip Generic drug: glucose blood Use as instructed three times a daily diag E11.65   hydrALAZINE 100 MG tablet Commonly known as: APRESOLINE Take 1 tablet (100 mg total) by mouth 3 (three) times daily. What changed: when to take this   levothyroxine 137 MCG tablet Commonly known as: SYNTHROID Take 1 tablet (137 mcg total) by mouth daily before breakfast.   mirtazapine 7.5 MG tablet Commonly known as: Remeron Take one tab po qhs for insomnia   nystatin 100000 UNIT/ML suspension Commonly known as: MYCOSTATIN Take 5 mLs (500,000 Units total) by mouth 4 (four) times daily.   oxyCODONE 5 MG  immediate release tablet Commonly known as: Oxy IR/ROXICODONE Take 1 tablet (5 mg total) by mouth every 4 (four) hours as needed for severe pain.   pimecrolimus 1 % cream Commonly known as: Elidel Apply topically 2 (two) times daily.   rOPINIRole 0.25 MG tablet Commonly known as: REQUIP Take 1 tablet (0.25 mg total) by mouth 2 (two) times daily.   saxagliptin HCl 5 MG Tabs tablet Commonly known as: Onglyza Take 1 tablet (5 mg total) by mouth daily.   traMADol 50 MG tablet Commonly known as: ULTRAM Take 1 tablet (50 mg total) by mouth 2 (two) times daily.   traZODone 50 MG tablet Commonly known as: DESYREL Take one 2 tabs at night for sleep   Trulance 3 MG Tabs Generic drug: Plecanatide Take 3 mg by mouth daily.   zolpidem 5 MG tablet Commonly known as: AMBIEN Take 1 tablet (5 mg total) by mouth at bedtime.       No Known Allergies  Consultations:  Cardiology   Procedures/Studies: Ct Angio Chest Pe W Or Wo Contrast  Result Date: 01/30/2019 CLINICAL DATA:  Shortness of breath EXAM: CT ANGIOGRAPHY CHEST WITH CONTRAST TECHNIQUE: Multidetector CT imaging of the chest was performed using the standard protocol during bolus administration of intravenous contrast. Multiplanar CT image reconstructions and MIPs were obtained to evaluate the vascular anatomy. CONTRAST:  21m OMNIPAQUE IOHEXOL 350 MG/ML SOLN COMPARISON:  12/16/2015 FINDINGS: Cardiovascular: Evaluation for pulmonary emboli is limited by respiratory motion artifact.Given this limitation, no PE was identified. The main pulmonary artery is within normal limits for size. There is no CT evidence of acute right heart strain. There are advanced atherosclerotic changes throughout the thoracic aorta, especially the descending aorta where there is mixed calcified and noncalcified plaque. Heart size is enlarged. There is no significant pericardial effusion. Coronary artery calcifications are noted. Mediastinum/Nodes: --again  noted are enlarged mediastinal and hilar lymph nodes. These appear to be relatively stable in size when compared to prior study with slight interval increase of several lymph nodes from 2017. --No axillary lymphadenopathy. --No supraclavicular lymphadenopathy. --Normal thyroid gland. --The esophagus is unremarkable Lungs/Pleura: There is a moderate-sized right-sided pleural effusion. There is a small left-sided pleural effusion. There is significant atelectasis involving both lower lobes. This results in partial collapse of the bilateral lower lobes. The lung volumes are low. There is a right infrahilar 2.7 cm hypoattenuating area. There is no pneumothorax. Upper Abdomen: No acute abnormality. Musculoskeletal: No chest wall abnormality. No acute or significant osseous findings. Review of the MIP images confirms the above findings. IMPRESSION: 1. Evaluation for pulmonary emboli is limited by respiratory motion artifact. Given this limitation, no PE was identified. 2. Cardiomegaly with coronary artery disease. 3. Bilateral pleural effusions, right greater than left, with associated partial collapse of the bilateral lower lobes. 4. There is a 2.7 cm hypoattenuating area within the right infrahilar region. This may represent a small area of consolidation, however, a mass is not excluded. Follow-up is recommended. 5. Stable mediastinal and hilar lymphadenopathy, likely reactive. 6. Aortic atherosclerosis. Aortic Atherosclerosis (ICD10-I70.0). Electronically Signed   By: CConstance HolsterM.D.   On: 01/30/2019 22:10   Dg Chest Port 1 View  Result Date: 02/01/2019 CLINICAL DATA:  Status post right thoracentesis. EXAM: PORTABLE CHEST 1 VIEW COMPARISON:  Chest radiograph and CT 2 days ago FINDINGS: Decreased right pleural effusion after thoracentesis. No visualized pneumothorax. Left pleural effusion with adjacent atelectasis, slightly improved from prior exam. Improved interstitial thickening likely improving  pulmonary edema. Unchanged heart size and mediastinal contours with aortic atherosclerosis. IMPRESSION: 1. Decreased right pleural effusion after thoracentesis. No visualized pneumothorax. 2. Improved pulmonary edema. 3. Left pleural effusion with adjacent atelectasis, slightly improved from prior exam. 4.  Aortic Atherosclerosis (ICD10-I70.0). Electronically Signed   By: MKeith RakeM.D.   On: 02/01/2019 14:14   Dg Chest Portable 1 View  Result Date: 01/30/2019 CLINICAL DATA:  Shortness of breath. Cough. EXAM: PORTABLE CHEST 1 VIEW COMPARISON:  Chest CT 12/16/2015 FINDINGS: Low lung volumes. Borderline cardiomegaly. There are bilateral pleural effusions, left greater than right, with adjacent compressive atelectasis. Peribronchial and interstitial thickening most consistent pulmonary edema. No pneumothorax. No acute osseous abnormalities are seen. IMPRESSION: Diffuse interstitial and bronchial thickening suspicious for pulmonary edema. Small bilateral pleural effusions with borderline cardiomegaly. Overall findings are most consistent with CHF. Aortic Atherosclerosis (ICD10-I70.0). Electronically Signed   By: MKeith RakeM.D.   On: 01/30/2019 18:59   UKoreaAbdomen Limited Ruq  Result Date: 01/31/2019  CLINICAL DATA:  Hepatomegaly. EXAM: ULTRASOUND ABDOMEN LIMITED RIGHT UPPER QUADRANT COMPARISON:  None. FINDINGS: Gallbladder: Previous cholecystectomy. Common bile duct: Diameter: 4.5 mm. Liver: Mild increased parenchymal echogenicity compatible with steatosis without focal mass. Portal vein is patent on color Doppler imaging with normal direction of blood flow towards the liver. Other: Possible small amount right pleural fluid. IMPRESSION: 1.  Previous cholecystectomy.  No acute hepatobiliary disease. 2.  Mild hepatic steatosis without focal mass. 3.  Small right pleural effusion. Electronically Signed   By: Marin Olp M.D.   On: 01/31/2019 09:54   US Thoracentesis Asp Pleural Space W/img  Guide  Result Date: 02/01/2019 INDICATION: 77 year old with small bilateral pleural effusions. EXAM: ULTRASOUND GUIDED RIGHT THORACENTESIS MEDICATIONS: None. COMPLICATIONS: None immediate. PROCEDURE: An ultrasound guided thoracentesis was thoroughly discussed with the patient and questions answered. The benefits, risks, alternatives and complications were also discussed. The patient understands and wishes to proceed with the procedure. Written consent was obtained. Ultrasound was performed to localize and mark an adequate pocket of fluid in the right chest. The area was then prepped and draped in the normal sterile fashion. 1% Lidocaine was used for local anesthesia. Under ultrasound guidance a 6 Fr Safe-T-Centesis catheter was introduced. Thoracentesis was performed. The catheter was removed and a dressing applied. FINDINGS: A total of approximately 250 of yellow fluid was removed. Samples were sent to the laboratory as requested by the clinical team. IMPRESSION: Successful ultrasound guided right thoracentesis yielding 250 mL of pleural fluid. Electronically Signed   By: Markus Daft M.D.   On: 02/01/2019 14:12       Subjective: Patient reports shortness of breath is better.  Denies any chest pain or any other symptoms.  Had good urine output.  Discharge Exam: Vitals:   02/01/19 1100 02/01/19 1200  BP: (!) 168/64 (!) 126/93  Pulse: 70 74  Resp: 16 19  Temp:    SpO2: 92% 93%   Vitals:   02/01/19 0900 02/01/19 1000 02/01/19 1100 02/01/19 1200  BP: (!) 171/65 (!) 164/63 (!) 168/64 (!) 126/93  Pulse: 78 76 70 74  Resp: 16 17 16 19   Temp:      TempSrc:      SpO2: 94% 94% 92% 93%  Weight:      Height:        General: Pt is alert, awake, not in acute distress Cardiovascular: RRR, S1/S2 +, no rubs, no gallops Respiratory: CTA bilaterally, no wheezing, no rhonchi. Decrease bs at b/l bases Abdominal: Soft, NT, ND, bowel sounds + Extremities: no edema, no cyanosis    The results of  significant diagnostics from this hospitalization (including imaging, microbiology, ancillary and laboratory) are listed below for reference.     Microbiology: Recent Results (from the past 240 hour(s))  SARS Coronavirus 2 by RT PCR (hospital order, performed in [facility] hospital lab) Nasopharyngeal Nasopharyngeal Swab     Status: None   Collection Time: 01/30/19  8:05 PM   Specimen: Nasopharyngeal Swab  Result Value Ref Range Status   SARS Coronavirus 2 NEGATIVE NEGATIVE Final    Comment: (NOTE) SARS-CoV-2 target nucleic acids are NOT DETECTED. The SARS-CoV-2 RNA is generally detectable in upper and lower respiratory specimens during the acute phase of infection. The lowest concentration of SARS-CoV-2 viral copies this assay can detect is 250 copies / mL. A negative result does not preclude SARS-CoV-2 infection and should not be used as the sole basis for treatment or other patient management decisions.  A negative result may  occur with improper specimen collection / handling, submission of specimen other than nasopharyngeal swab, presence of viral mutation(s) within the areas targeted by this assay, and inadequate number of viral copies (<250 copies / mL). A negative result must be combined with clinical observations, patient history, and epidemiological information. Fact Sheet for Patients:   StrictlyIdeas.no Fact Sheet for Healthcare Providers: BankingDealers.co.za This test is not yet approved or cleared  by the [place] FDA and has been authorized for detection and/or diagnosis of SARS-CoV-2 by FDA under an Emergency Use Authorization (EUA).  This EUA will remain in effect (meaning this test can be used) for the duration of the COVID-19 declaration under Section 564(b)(1) of the Act, 21 U.S.C. section 360bbb-3(b)(1), unless the authorization is terminated or revoked sooner. Performed at [facility], [address] [place], [place] [address]      Labs: BNP (last 3 results) Recent Labs    01/30/19 1832 02/01/19 0527  BNP 864.0* 557.3*   Basic Metabolic Panel: Recent Labs  Lab 01/30/19 1828 01/30/19 2130 01/31/19 0146 02/01/19 0527  NA 134* 132* 131* 137  K 4.1 5.1 4.7 4.2  CL 103 102 99 103  CO2 20* 20* 21* 26  GLUCOSE 168* 260* 304* 97  BUN 26* 27* 29* 27*  CREATININE 1.07* 1.23* 1.24* 1.31*  CALCIUM 8.9 8.6* 8.8* 8.7*  MG  --   --  1.8  --   PHOS  --   --  5.1*  --    Liver Function Tests: Recent Labs  Lab 01/30/19 2130 01/31/19 0146  AST 18 9*  ALT 15 15  ALKPHOS 77 80  BILITOT 0.7 0.9  PROT 7.4 7.7  ALBUMIN 3.0* 3.3*   No results for input(s): LIPASE, AMYLASE in the last 168 hours. No results for input(s): AMMONIA in the last 168 hours. CBC: Recent Labs  Lab 01/30/19 1828 01/31/19 0146  WBC 11.0* 7.9  HGB 12.1 11.7*  HCT 39.1 37.7  MCV 85.7 85.7  PLT 339 319   Cardiac Enzymes: No results for input(s): CKTOTAL, CKMB, CKMBINDEX, TROPONINI in the last 168 hours. BNP: Invalid input(s): POCBNP CBG: Recent Labs  Lab 01/31/19 2000 01/31/19 2352 02/01/19 0316 02/01/19 0745 02/01/19 1136  GLUCAP 135* 90 97 107* 230*   D-Dimer No results for input(s): DDIMER in the last 72 hours. Hgb A1c Recent Labs    01/31/19 0146  HGBA1C 7.9*   Lipid Profile No results for input(s): CHOL, HDL, LDLCALC, TRIG, CHOLHDL, LDLDIRECT in the last 72 hours. Thyroid function studies Recent Labs    01/31/19 0146  TSH 3.678   Anemia work up No results for input(s): VITAMINB12, FOLATE, FERRITIN, TIBC, IRON, RETICCTPCT in the last 72 hours. Urinalysis No results found for: COLORURINE, APPEARANCEUR, [place], [place], [place], [place], [place], [place], PROTEINUR, UROBILINOGEN, NITRITE, LEUKOCYTESUR Sepsis Labs Invalid input(s): PROCALCITONIN,  WBC,  LACTICIDVEN Microbiology Recent Results (from the past 240 hour(s))  SARS Coronavirus 2 by RT PCR (hospital  order, performed in [facility] hospital lab) Nasopharyngeal Nasopharyngeal Swab     Status: None   Collection Time: 01/30/19  8:05 PM   Specimen: Nasopharyngeal Swab  Result Value Ref Range Status   SARS Coronavirus 2 NEGATIVE NEGATIVE Final    Comment: (NOTE) SARS-CoV-2 target nucleic acids are NOT DETECTED. The SARS-CoV-2 RNA is generally detectable in upper and lower respiratory specimens during the acute phase of infection. The lowest concentration of SARS-CoV-2 viral copies this assay can detect is 250 copies / mL. A negative  result does not preclude SARS-CoV-2 infection and should not be used as the sole basis for treatment or other patient management decisions.  A negative result may occur with improper specimen collection / handling, submission of specimen other than nasopharyngeal swab, presence of viral mutation(s) within the areas targeted by this assay, and inadequate number of viral copies (<250 copies / mL). A negative result must be combined with clinical observations, patient history, and epidemiological information. Fact Sheet for Patients:   StrictlyIdeas.no Fact Sheet for Healthcare Providers: BankingDealers.co.za This test is not yet approved or cleared  by the [place] FDA and has been authorized for detection and/or diagnosis of SARS-CoV-2 by FDA under an Emergency Use Authorization (EUA).  This EUA will remain in effect (meaning this test can be used) for the duration of the COVID-19 declaration under Section 564(b)(1) of the Act, 21 U.S.C. section 360bbb-3(b)(1), unless the authorization is terminated or revoked sooner. Performed at [facility], [address], [place], [place] [address]      Time coordinating discharge: Over 30 minutes  SIGNED:   Nolberto Hanlon, MD  [facility] 02/01/2019, 2:32 PM Pager   If 7PM-7AM, please contact night-coverage [email or web address] Password [email or web address]

## 2019-02-01 NOTE — Progress Notes (Signed)
OT Cancellation Note  Patient Details Name: Evelyn Stark MRN: [id number] DOB: Jul 18, 1940   Cancelled Treatment:     Patient for US guided thoracentesis this date, will continue attempts for OT evaluation next date.   Felis Quillin T Yuliza Cara, OTR/L, CLT   [place] Coldren 02/01/2019, 1:52 PM

## 2019-02-01 NOTE — Consult Note (Signed)
Cardiology Consultation:   Patient ID: Evelyn Stark; [id number]; 09-02-1940   Admit date: 01/30/2019 Date of Consult: 02/01/2019  Primary Care Provider: Ronnell Freshwater, NP Primary Cardiologist: New to [facility] - consult by [place]   Patient Profile:   Evelyn Stark is a 78 y.o. female with a hx of CAD with non-STEMI in 10/2003 with remote stenting to the LAD, LCx, RCA, and OM1, DM2, HTN, HLD, hypothyroidism, CKD stage III, bronchiectasis with chronic inflammatory scarring of the lower lobes, small airway disease, pulmonary nodules, and morbid obesity who is being seen today for the evaluation of elevated BNP at the request of Dr. Kurtis Bushman.  History of Present Illness:   Ms. Codd has known history of non-STEMI in 2005 with remote stenting as detailed above.  She underwent cardiac cath by Dr. Terrence Dupont on 11/27/2012 in the setting of exertional chest pain and abnormal Lexiscan Myoview which showed patent left main, patent LAD stent, small D1 and D2 which were patent, patent LCx stent with distal 40 to 50% bifurcation stenosis with OM 3, OM1 50 to 60% ostial stenosis with the vessel being moderate in size, OM2 small and diffusely diseased unchanged from prior, OM 3 very small with 30 to 40% ostial stenosis, RCA 20 to 25% proximal stenosis, mid RCA stent widely patent, PDA and PLV branches patent and small.  LVEF 55 to 60%.  Patient was medically managed.  Back in 2017 she was evaluated by pulmonology for chronic dyspnea with imaging at that time indicative of bronchiectasis in the bilateral lower lungs with some areas of post infectious/inflammatory scarring as well as groundglass attenuation and small airway disease.  Bronchoscopy was unrevealing at that time.  She was lost to follow-up as she preferred to go back to her home country, [place].  Patient's daughter is uncertain what her pulmonology work-up has been in the interim.  More recently, patient returned to the [place] from [place] in 12/2018 and was  initially noted to have chronic stable dyspnea.  However, the past week she was having issues with low back pain and visited a chiropractor.  Following this adjustment she began to notice "right rib pain."  In this setting, the patient's daughter, who is a physician, noted the patient was breathing shallow.  With this, the patient's dyspnea became progressive prompting her to come to [facility].  At baseline, the patient lives a very sedentary lifestyle when living in [place].  However, the family prefers if she is more active when visiting the [place].  Patient was never a smoker and family denies any chemical exposures.  There has been no lower extremity swelling, abdominal surgeon, orthopnea, or early satiety.  No chest pain or palpitations.  Upon the patient's arrival to [facility] they were found to have BP ranging from the 673A to 193X systolic, afebrile, oxygen saturation from the 80s to 90s on 4 L via nasal cannula, weight 73 kg. EKG showed sinus rhythm with PACs as outlined below, CXR showed diffuse interstitial and bronchial thickening suspicious for pulmonary edema with small bilateral pleural effusions.  CTA chest limited by respiration motion artifact though no PE was identified, bilateral pleural effusions with right being greater than left with associated partial collapse of bilateral lower lobes, cardiomegaly with coronary artery calcification, 2.7 hypoattenuating lesion within the right infrahilar region possibly representing consolidation versus mass, state mediastinal and hilar lymphadenopathy, aortic atherosclerosis.  Right upper quadrant abdominal ultrasound showed prior cholecystectomy with no acute hepatobiliary disease with mild hepatic steatosis without focal mass  and small right pleural effusion.  Labs showed BNP 864 trending to 709, high-sensitivity troponin negative x4, BUN 26 trending to 27, serum creatinine 1.07 trending to 1.31, WBC 11.0 trending to 7.9, COVID-19 negative, D-dimer 2019.80.  Echo on  01/31/2019 showed an EF of 60 to 65%, no LVH, normal RV systolic function and cavity size, mildly to moderately dilated left atrium, mild to moderate mitral regurgitation, mild tricuspid regurgitation, moderate aortic valve sclerosis without stenosis, normal PASP.  She has received IV Lasix 80 mg followed by 40 mg as well as azithromycin and Rocephin.  Documented urine output of 969 mL for the past 24 hours with a net -2 L for the admission.  Patient's daughter indicates the patient looks "much better" this morning.  She is currently without complaint.  Past Medical History:  Diagnosis Date   Asthma    Coronary artery disease    Depression    Diabetes mellitus without complication ([place])    Hyperlipidemia    Hypertension    Hypothyroidism     Past Surgical History:  Procedure Laterality Date   CHOLECYSTECTOMY N/A 11/20/2015   Procedure: LAPAROSCOPIC CHOLECYSTECTOMY WITH INTRAOPERATIVE CHOLANGIOGRAM;  Surgeon: Christene Lye, MD;  Location: [facility] ORS;  Service: General;  Laterality: N/A;   COLONOSCOPY  2000   coronary stents     x 3   EYE SURGERY     LEFT HEART CATHETERIZATION WITH CORONARY ANGIOGRAM N/A 11/27/2012   Procedure: LEFT HEART CATHETERIZATION WITH CORONARY ANGIOGRAM;  Surgeon: Clent Demark, MD;  Location: [facility] CATH [facility];  Service: Cardiovascular;  Laterality: N/A;   MINOR HEMORRHOIDECTOMY       Home Meds: Prior to Admission medications   Medication Sig Start Date End Date Taking? Authorizing Provider  ALPRAZolam (XANAX) 0.25 MG tablet Take 1 tablet (0.25 mg total) by mouth 2 (two) times daily as needed for sleep or anxiety. 01/28/19  Yes Lavera Guise, MD  amLODipine (NORVASC) 10 MG tablet Take 1 tablet (10 mg total) by mouth 2 (two) times daily. 10/03/18  Yes Lavera Guise, MD  aspirin EC 81 MG tablet Take 81 mg by mouth daily.   Yes [provider]  atorvastatin (LIPITOR) 40 MG tablet Take 0.5 tablets (20 mg total) by mouth daily. 01/28/19  Yes  Lavera Guise, MD  budesonide-formoterol [facility]) 160-4.5 MCG/ACT inhaler Inhale 2 puffs into the lungs 2 (two) times daily as needed. 10/03/18  Yes Lavera Guise, MD  escitalopram (LEXAPRO) 20 MG tablet Take one tab po qd for depression 01/28/19  Yes Lavera Guise, MD  esomeprazole (NEXIUM) 40 MG capsule Take 1 capsule (40 mg total) by mouth daily. 01/28/19  Yes Lavera Guise, MD  gabapentin (NEURONTIN) 100 MG capsule Take 1 capsule (100 mg total) by mouth 3 (three) times daily. 12/25/18  Yes Boscia, Heather E, NP  glimepiride (AMARYL) 2 MG tablet Take 1 tablet (2 mg total) by mouth 2 (two) times daily. 12/25/18  Yes Boscia, Greer Ee, NP  hydrALAZINE (APRESOLINE) 100 MG tablet Take 1 tablet (100 mg total) by mouth 3 (three) times daily. Patient taking differently: Take 100 mg by mouth 2 (two) times daily.  10/03/18  Yes Lavera Guise, MD  Insulin Glargine [facility]) 100 UNIT/ML SOPN Use 20-25 units with supper qd Patient taking differently: Inject 8-12 Units into the skin 2 (two) times daily. Inject 8 units daily in the morning and 12 units daily in the evening 12/18/18  Yes Lavera Guise, MD  levothyroxine (SYNTHROID) 137 MCG tablet Take 1 tablet (137 mcg total) by mouth daily before breakfast. 10/03/18  Yes Lavera Guise, MD  Nebivolol HCl (BYSTOLIC) 20 MG TABS Take one tab po bid Patient taking differently: Take 20 mg by mouth daily.  10/03/18  Yes Lavera Guise, MD  rOPINIRole (REQUIP) 0.25 MG tablet Take 1 tablet (0.25 mg total) by mouth 2 (two) times daily. 12/25/18  Yes Boscia, Greer Ee, NP  zolpidem (AMBIEN) 5 MG tablet Take 1 tablet (5 mg total) by mouth at bedtime. 10/03/18  Yes Lavera Guise, MD  azithromycin [facility]) 250 MG tablet Take one tab a day for 10 days for uri Patient not taking: Reported on 01/30/2019 10/03/18   Lavera Guise, MD  Blood Glucose Monitoring Suppl [facility] BLOOD GLUCOSE SYSTEM) w/Device KIT Use as directed diag E11.65 09/29/17   Ronnell Freshwater, NP    Blood Glucose Monitoring Suppl (CONTOUR NEXT MONITOR) w/Device KIT 1 each by Does not apply route daily. Use as directed Diag E11.65 12/25/18   Ronnell Freshwater, NP  canagliflozin (INVOKANA) 300 MG TABS tablet Take 1 tablet (300 mg total) by mouth daily before breakfast. Patient not taking: Reported on 01/30/2019 10/03/18   Lavera Guise, MD  glucose blood (CONTOUR NEXT TEST) test strip Use as instructed three times a daily diag E11.65 12/26/18   Ronnell Freshwater, NP  glucose blood (CONTOUR TEST) test strip 1 each by Other route 3 (three) times daily. diag e11.65 09/29/17   Ronnell Freshwater, NP  Insulin Pen Needle (BD PEN NEEDLE NANO U/F) 32G X 4 MM MISC Use as directed with insulin 01/01/19   Ronnell Freshwater, NP  mirtazapine (REMERON) 7.5 MG tablet Take one tab po qhs for insomnia Patient not taking: Reported on 01/30/2019 12/18/18   Lavera Guise, MD  nystatin (MYCOSTATIN) 100000 UNIT/ML suspension Take 5 mLs (500,000 Units total) by mouth 4 (four) times daily. Patient not taking: Reported on 01/30/2019 09/29/17   Lavera Guise, MD  oxyCODONE (OXY IR/ROXICODONE) 5 MG immediate release tablet Take 1 tablet (5 mg total) by mouth every 4 (four) hours as needed for severe pain. 01/29/19   Kendell Bane, NP  pimecrolimus (ELIDEL) 1 % cream Apply topically 2 (two) times daily. Patient not taking: Reported on 01/30/2019 09/29/17   Lavera Guise, MD  Plecanatide (TRULANCE) 3 MG TABS Take 3 mg by mouth daily. Patient not taking: Reported on 01/30/2019 10/03/18   Lavera Guise, MD  saxagliptin HCl (ONGLYZA) 5 MG TABS tablet Take 1 tablet (5 mg total) by mouth daily. Patient not taking: Reported on 01/30/2019 10/03/18   Lavera Guise, MD  traMADol (ULTRAM) 50 MG tablet Take 1 tablet (50 mg total) by mouth 2 (two) times daily. 12/24/18   Ronnell Freshwater, NP  traZODone (DESYREL) 50 MG tablet Take one 2 tabs at night for sleep Patient not taking: Reported on 01/30/2019 09/29/17   Lavera Guise, MD     Inpatient Medications: Scheduled Meds:  aerochamber plus with mask  1 each Other Once   aspirin EC  81 mg Oral Daily   atorvastatin  20 mg Oral q1800   Chlorhexidine Gluconate Cloth  6 each Topical Daily   enoxaparin (LOVENOX) injection  40 mg Subcutaneous Q24H   escitalopram  20 mg Oral QHS   gabapentin  100 mg Oral TID   insulin aspart  0-9 Units Subcutaneous Q4H   insulin glargine  12 Units Subcutaneous  QHS   levothyroxine  137 mcg Oral QAC breakfast   mometasone-formoterol  2 puff Inhalation BID   nebivolol  20 mg Oral Daily   pantoprazole  40 mg Oral Daily   rOPINIRole  0.25 mg Oral BID   sodium chloride flush  3 mL Intravenous Q12H   Continuous Infusions:  sodium chloride Stopped (02/01/19 0321)   azithromycin Stopped (02/01/19 0250)   cefTRIAXone (ROCEPHIN)  IV Stopped (02/01/19 0126)   PRN Meds: sodium chloride, acetaminophen **OR** acetaminophen, albuterol, ondansetron **OR** ondansetron (ZOFRAN) IV, sodium chloride flush, traMADol  Allergies:  No Known Allergies  Social History:   Social History   Socioeconomic History   Marital status: Widowed    Spouse name: Not on file   Number of children: Not on file   Years of education: Not on file   Highest education level: Not on file  Occupational History   Not on file  Social Needs   Financial resource strain: Not on file   Food insecurity    Worry: Not on file    Inability: Not on file   Transportation needs    Medical: Not on file    Non-medical: Not on file  Tobacco Use   Smoking status: Never Smoker   Smokeless tobacco: Never Used  Substance and Sexual Activity   Alcohol use: No   Drug use: No   Sexual activity: Not on file  Lifestyle   Physical activity    Days per week: Not on file    Minutes per session: Not on file   Stress: Not on file  Relationships   Social connections    Talks on phone: Not on file    Gets together: Not on file    Attends religious  service: Not on file    Active member of club or organization: Not on file    Attends meetings of clubs or organizations: Not on file    Relationship status: Not on file   Intimate partner violence    Fear of current or ex partner: Not on file    Emotionally abused: Not on file    Physically abused: Not on file    Forced sexual activity: Not on file  Other Topics Concern   Not on file  Social History Narrative   Not on file     Family History:   Family History  Problem Relation Age of Onset   Diabetes Neg Hx    CAD Neg Hx    Cancer Neg Hx     ROS:  Review of Systems  Constitutional: Positive for malaise/fatigue. Negative for chills, diaphoresis, fever and weight loss.  HENT: Negative for congestion.   Eyes: Negative for discharge and redness.  Respiratory: Positive for shortness of breath. Negative for cough, hemoptysis, sputum production and wheezing.   Cardiovascular: Negative for chest pain, palpitations, orthopnea, claudication, leg swelling and PND.  Gastrointestinal: Negative for abdominal pain, blood in stool, heartburn, melena, nausea and vomiting.  Genitourinary: Negative for hematuria.  Musculoskeletal: Positive for back pain and joint pain. Negative for falls and myalgias.  Skin: Negative for rash.  Neurological: Positive for weakness. Negative for dizziness, tingling, tremors, sensory change, speech change, focal weakness and loss of consciousness.  Endo/Heme/Allergies: Does not bruise/bleed easily.  Psychiatric/Behavioral: Negative for substance abuse. The patient is not nervous/anxious.   All other systems reviewed and are negative.     Physical Exam/Data:   Vitals:   01/31/19 2000 01/31/19 2005 01/31/19 2010 01/31/19 2020  BP:    Marland Kitchen)  170/61  Pulse: 70 73 (!) 57 73  Resp: (!) 21 17 20 18   Temp: 98.6 F (37 C)     TempSrc: Oral     SpO2: 91% (!) 88% 90% 93%  Weight:      Height:        Intake/Output Summary (Last 24 hours) at 02/01/2019  0719 Last data filed at 02/01/2019 0400 Gross per 24 hour  Intake 1080.95 ml  Output 1600 ml  Net -519.05 ml   Filed Weights   01/30/19 1830 01/31/19 0500  Weight: 72.6 kg 73 kg   Body mass index is 27.62 kg/m.   Physical Exam: General: Elderly appearing, well developed, well nourished, in no acute distress. Head: Normocephalic, atraumatic, sclera non-icteric, no xanthomas, nares without discharge.  Neck: Negative for carotid bruits. JVD not elevated. Lungs: Diminished breath sounds bilaterally along the bases with the right being greater than the left. Breathing is unlabored. Heart: RRR with frequent extrasystoles with S1 S2. No murmurs, rubs, or gallops appreciated. Abdomen: Soft, non-tender, non-distended with normoactive bowel sounds. No hepatomegaly. No rebound/guarding. No obvious abdominal masses. Msk:  Strength and tone appear normal for age. Extremities: No clubbing or cyanosis. No edema. Distal pedal pulses are 2+ and equal bilaterally. Neuro: Alert and oriented X 3. No facial asymmetry. No focal deficit. Moves all extremities spontaneously. Psych:  Responds to questions appropriately with a normal affect.   EKG:  The EKG was personally reviewed and demonstrates: 11/25, 18:38 - NSR, 74 bpm, occasional PACs, no acute ST-T changes.  11/25, 23:58 - NSR, 72 bpm, occasional PACs, no acute ST-T changes.  11/26, 00:19 -NSR, 70 bpm, occasional PACs, no acute ST-T changes Telemetry:  Telemetry was personally reviewed and demonstrates: Sinus rhythm with PACs  Weights: Filed Weights   01/30/19 1830 01/31/19 0500  Weight: 72.6 kg 73 kg    Relevant CV Studies: 2D echo 01/31/2019: 1. Left ventricular ejection fraction, by visual estimation, is 60 to 65%. The left ventricle has normal function. There is no left ventricular hypertrophy.  2. Global right ventricle has normal systolic function.The right ventricular size is normal. No increase in right ventricular wall thickness.  3.  Left atrial size was mild-moderately dilated.  4. Mild to moderate mitral valve regurgitation.  5. Tricuspid valve regurgitation is mild.  6. The aortic valve is normal in structure. Moderate aortic valve sclerosis/calcification without any evidence of aortic stenosis.  7. The pulmonic valve was normal in structure. Pulmonic valve regurgitation is not visualized.  8. Normal pulmonary artery systolic pressure.  Laboratory Data:  Chemistry Recent Labs  Lab 01/30/19 2130 01/31/19 0146 02/01/19 0527  NA 132* 131* 137  K 5.1 4.7 4.2  CL 102 99 103  CO2 20* 21* 26  GLUCOSE 260* 304* 97  BUN 27* 29* 27*  CREATININE 1.23* 1.24* 1.31*  CALCIUM 8.6* 8.8* 8.7*  GFRNONAA 41* 41* 38*  GFRAA 48* 48* 44*  ANIONGAP 10 11 8     Recent Labs  Lab 01/30/19 2130 01/31/19 0146  PROT 7.4 7.7  ALBUMIN 3.0* 3.3*  AST 18 9*  ALT 15 15  ALKPHOS 77 80  BILITOT 0.7 0.9   Hematology Recent Labs  Lab 01/30/19 1828 01/31/19 0146  WBC 11.0* 7.9  RBC 4.56 4.40  HGB 12.1 11.7*  HCT 39.1 37.7  MCV 85.7 85.7  MCH 26.5 26.6  MCHC 30.9 31.0  RDW 13.7 13.7  PLT 339 319   Cardiac EnzymesNo results for input(s): TROPONINI in the last 168 hours.  No results for input(s): TROPIPOC in the last 168 hours.  BNP Recent Labs  Lab 01/30/19 1832 02/01/19 0527  BNP 864.0* 709.0*    DDimer No results for input(s): DDIMER in the last 168 hours.  Radiology/Studies:  Ct Angio Chest Pe W Or Wo Contrast  Result Date: 01/30/2019 IMPRESSION: 1. Evaluation for pulmonary emboli is limited by respiratory motion artifact. Given this limitation, no PE was identified. 2. Cardiomegaly with coronary artery disease. 3. Bilateral pleural effusions, right greater than left, with associated partial collapse of the bilateral lower lobes. 4. There is a 2.7 cm hypoattenuating area within the right infrahilar region. This may represent a small area of consolidation, however, a mass is not excluded. Follow-up is recommended. 5.  Stable mediastinal and hilar lymphadenopathy, likely reactive. 6. Aortic atherosclerosis. Aortic Atherosclerosis (ICD10-I70.0). Electronically Signed   By: Constance Holster M.D.   On: 01/30/2019 22:10   Dg Chest Portable 1 View  Result Date: 01/30/2019 IMPRESSION: Diffuse interstitial and bronchial thickening suspicious for pulmonary edema. Small bilateral pleural effusions with borderline cardiomegaly. Overall findings are most consistent with CHF. Aortic Atherosclerosis (ICD10-I70.0). Electronically Signed   By: Keith Rake M.D.   On: 01/30/2019 18:59   US Abdomen Limited Ruq  Result Date: 01/31/2019 IMPRESSION: 1.  Previous cholecystectomy.  No acute hepatobiliary disease. 2.  Mild hepatic steatosis without focal mass. 3.  Small right pleural effusion. Electronically Signed   By: Marin Olp M.D.   On: 01/31/2019 09:54    Assessment and Plan:   1.  Acute on chronic dyspnea with bilateral pleural effusions with likely component of acute diastolic CHF with underlying likely pulmonary disease: -Patient has extensive history of chronic dyspnea with partial work-up in 2017 prior to traveling back to [place] of uncertain etiology -She now presents with progressive dyspnea and thick significant bilateral pleural effusions with the right being greater than the left -Echo showed preserved LV systolic function with normal RV cavity size and no evidence of volume overload making chronic PE much less likely, however, this could be evaluated with VQ scan if indicated -CTA chest negative for acute PE -Cannot exclude component of diastolic CHF -Recommend diagnostic and therapeutic right-sided thoracentesis -Continue gentle diuresis with IV Lasix with close monitoring of renal function -If symptoms persist or worsen could consider diagnostic R/LHC, potentially even as an outpatient for further evaluation -Much improved following gentle IV diuresis since admission  2.  CAD involving the native  coronary arteries without angina: -Most recent cath from 10/2012 demonstrated patent stents as outlined above -High-sensitivity troponin negative x4 this admission -Echo with preserved LV systolic function -If symptoms persist despite treatment as above could consider R/LHC down the road, potentially even as an outpatient -Continue ASA and Lipitor  3.  CKD stage III: -Renal function appears at approximate baseline -Close monitoring with gentle diuresis  5.  HTN: -Blood pressure mildly elevated  6.  HLD: -Most recent LDL of 107 from 12/2018 -Goal LDL less than 70 -Consider escalation of Lipitor after discussing with patient's daughter, who is a physician   For questions or updates, please contact [place] Please consult [email or web address] for contact info under Cardiology/STEMI.   Signed, Christell Faith, PA-C [place] Pager: [phone] 02/01/2019, 7:19 AM

## 2019-02-02 LAB — TRIGLYCERIDES, BODY FLUIDS: Triglycerides, Fluid: 9 mg/dL

## 2019-02-02 LAB — PROTEIN, BODY FLUID (OTHER): Total Protein, Body Fluid Other: 2.2 g/dL

## 2019-02-04 ENCOUNTER — Telehealth: Payer: Self-pay

## 2019-02-04 NOTE — Telephone Encounter (Addendum)
Spoke to pt's daughter, Latricia Heft.  Latricia Heft would like to look at her schedule and call back to make an appointment.  Will await call back.

## 2019-02-04 NOTE — Telephone Encounter (Signed)
-----   Message from Tyler Pita, MD sent at 02/01/2019  3:32 PM EST ----- Regarding: Appt. needed I was consulted on this patient however she got discharged before I could see her.  She is a patient of Dr. Zoila Shutter and she is the mother-in-law of Dr. Devona Konig.  She will need a follow-up appointment, she had pleural effusions with a thoracentesis done prior to leaving the hospital to me the numbers look like they are related  to her heart failure.

## 2019-02-05 ENCOUNTER — Telehealth: Payer: Self-pay

## 2019-02-05 LAB — COMP PANEL: LEUKEMIA/LYMPHOMA

## 2019-02-05 LAB — CYTOLOGY - NON PAP

## 2019-02-05 LAB — BODY FLUID CULTURE: Culture: NO GROWTH

## 2019-02-05 NOTE — Telephone Encounter (Signed)
ATC pt's daughter, Evelyn Stark).  Received bust dial.  Will call back.

## 2019-02-05 NOTE — Telephone Encounter (Signed)
Faxed [facility] orders for oxygen. Beth

## 2019-02-06 ENCOUNTER — Other Ambulatory Visit: Payer: Self-pay

## 2019-02-06 LAB — PH, BODY FLUID: pH, Body Fluid: 8

## 2019-02-06 MED ORDER — ZOLPIDEM TARTRATE 10 MG PO TABS: 10.0000 mg | ORAL_TABLET | Freq: Every evening | ORAL | 1 refills | Status: DC | PRN

## 2019-02-06 MED ORDER — LEVOFLOXACIN 500 MG PO TABS: 500.0000 mg | ORAL_TABLET | Freq: Every day | ORAL | 0 refills | Status: DC

## 2019-02-06 MED ORDER — FUROSEMIDE 20 MG PO TABS: 20.0000 mg | ORAL_TABLET | Freq: Every day | ORAL | 1 refills | Status: DC

## 2019-02-06 MED ORDER — MOMETASONE FURO-FORMOTEROL FUM 100-5 MCG/ACT IN AERO: 2.0000 | INHALATION_SPRAY | Freq: Two times a day (BID) | RESPIRATORY_TRACT | 1 refills | Status: DC

## 2019-02-06 NOTE — Telephone Encounter (Signed)
Send med to [facility]ar as per [facility]

## 2019-02-07 ENCOUNTER — Ambulatory Visit (INDEPENDENT_AMBULATORY_CARE_PROVIDER_SITE_OTHER): Payer: Medicare Other | Admitting: Internal Medicine

## 2019-02-07 VITALS — BP 150/70 | HR 66 | Wt 145.0 lb

## 2019-02-07 DIAGNOSIS — R0902 Hypoxemia: Secondary | ICD-10-CM | POA: Diagnosis not present

## 2019-02-07 DIAGNOSIS — M48061 Spinal stenosis, lumbar region without neurogenic claudication: Secondary | ICD-10-CM | POA: Diagnosis not present

## 2019-02-07 DIAGNOSIS — I50813 Acute on chronic right heart failure: Secondary | ICD-10-CM | POA: Diagnosis not present

## 2019-02-07 DIAGNOSIS — E1165 Type 2 diabetes mellitus with hyperglycemia: Secondary | ICD-10-CM | POA: Diagnosis not present

## 2019-02-07 DIAGNOSIS — J189 Pneumonia, unspecified organism: Secondary | ICD-10-CM

## 2019-02-07 LAB — ACID FAST SMEAR (AFB, MYCOBACTERIA): Acid Fast Smear: NEGATIVE

## 2019-02-11 NOTE — Telephone Encounter (Signed)
ATC pt's daughter, Latricia Heft [facility]). Line rang for >73min with no option to leave vm.  Will call back.

## 2019-02-12 NOTE — Telephone Encounter (Signed)
ATC pt's daughter, Latricia Heft [facility]). Letter will be mailed to address on file requesting that pt contact our office to schedule an appt.

## 2019-02-17 ENCOUNTER — Encounter: Payer: Self-pay | Admitting: Internal Medicine

## 2019-02-17 NOTE — Progress Notes (Addendum)
[facility] [place], [place] [address]  Internal MEDICINE  Office Visit Note  Patient Name: Evelyn Stark  [id number]  [id number]  Date of Service: 02/17/2019  Transitional care management note    Chief Complaint  Patient presents with  . Shortness of Breath    acute pulmonary edema   . Hypertension  . Hypothyroidism  . Diabetes  . Depression  . Back Pain     HPI Pt is here for recent hospital follow up.Pt had an episode of acute sob while using the bathroom, she became sob and family called paramedics, she was negative for C-19 virus, found to have acute pulmonary edema and ?? Pneumonia, she was started on IV lasix and azithromycin and O2 therapy, she gradually improved and was discharged home on 3RD day All diagnostics from her admission is reviewed, she had normal Echo, CT chest was abnormal which she will need follow up Diabetes is under good control, She was started on Lasix as well which will be used on prn basis   Current Medication: Outpatient Encounter Medications as of 02/07/2019  Medication Sig  . ALPRAZolam (XANAX) 0.25 MG tablet Take 1 tablet (0.25 mg total) by mouth 2 (two) times daily as needed for sleep or anxiety.  Marland Kitchen amLODipine (NORVASC) 10 MG tablet Take 1 tablet (10 mg total) by mouth 2 (two) times daily.  Marland Kitchen amoxicillin-clavulanate (AUGMENTIN) 875-125 MG tablet Take 1 tablet by mouth every 12 (twelve) hours.  Marland Kitchen aspirin EC 81 MG tablet Take 81 mg by mouth daily.  Marland Kitchen atorvastatin (LIPITOR) 40 MG tablet Take 0.5 tablets (20 mg total) by mouth daily.  Marland Kitchen azithromycin (ZITHROMAX) 500 MG tablet Take 1 tablet (500 mg total) by mouth daily.  . Blood Glucose Monitoring Suppl (CONTOUR BLOOD GLUCOSE SYSTEM) w/Device KIT Use as directed diag E11.65  . Blood Glucose Monitoring Suppl (CONTOUR NEXT MONITOR) w/Device KIT 1 each by Does not apply route daily. Use as directed Diag E11.65  . budesonide-formoterol (SYMBICORT) 160-4.5 MCG/ACT inhaler Inhale  2 puffs into the lungs 2 (two) times daily as needed.  . canagliflozin (INVOKANA) 300 MG TABS tablet Take 1 tablet (300 mg total) by mouth daily before breakfast. (Patient not taking: Reported on 01/30/2019)  . escitalopram (LEXAPRO) 20 MG tablet Take one tab po qd for depression  . esomeprazole (NEXIUM) 40 MG capsule Take 1 capsule (40 mg total) by mouth daily.  . furosemide (LASIX) 20 MG tablet Take 1 tablet (20 mg total) by mouth daily.  Marland Kitchen gabapentin (NEURONTIN) 100 MG capsule Take 1 capsule (100 mg total) by mouth 3 (three) times daily.  Marland Kitchen glimepiride (AMARYL) 2 MG tablet Take 1 tablet (2 mg total) by mouth 2 (two) times daily.  Marland Kitchen glucose blood (CONTOUR NEXT TEST) test strip Use as instructed three times a daily diag E11.65  . glucose blood (CONTOUR TEST) test strip 1 each by Other route 3 (three) times daily. diag e11.65  . hydrALAZINE (APRESOLINE) 100 MG tablet Take 1 tablet (100 mg total) by mouth 3 (three) times daily. (Patient taking differently: Take 100 mg by mouth 2 (two) times daily. )  . Insulin Glargine (BASAGLAR KWIKPEN) 100 UNIT/ML SOPN Use 20-25 units with supper qd (Patient taking differently: Inject 8-12 Units into the skin 2 (two) times daily. Inject 8 units daily in the morning and 12 units daily in the evening)  . Insulin Pen Needle (BD PEN NEEDLE NANO U/F) 32G X 4 MM MISC Use as directed with insulin  .  levofloxacin (LEVAQUIN) 500 MG tablet Take 1 tablet (500 mg total) by mouth daily.  Marland Kitchen levothyroxine (SYNTHROID) 137 MCG tablet Take 1 tablet (137 mcg total) by mouth daily before breakfast.  . mirtazapine (REMERON) 7.5 MG tablet Take one tab po qhs for insomnia (Patient not taking: Reported on 01/30/2019)  . mometasone-formoterol (DULERA) 100-5 MCG/ACT AERO Inhale 2 puffs into the lungs 2 (two) times daily.  . Nebivolol HCl (BYSTOLIC) 20 MG TABS Take one tab po bid (Patient taking differently: Take 20 mg by mouth daily. )  . nystatin (MYCOSTATIN) 100000 UNIT/ML suspension Take  5 mLs (500,000 Units total) by mouth 4 (four) times daily. (Patient not taking: Reported on 01/30/2019)  . oxyCODONE (OXY IR/ROXICODONE) 5 MG immediate release tablet Take 1 tablet (5 mg total) by mouth every 4 (four) hours as needed for severe pain.  . pimecrolimus (ELIDEL) 1 % cream Apply topically 2 (two) times daily. (Patient not taking: Reported on 01/30/2019)  . Plecanatide (TRULANCE) 3 MG TABS Take 3 mg by mouth daily. (Patient not taking: Reported on 01/30/2019)  . rOPINIRole (REQUIP) 0.25 MG tablet Take 1 tablet (0.25 mg total) by mouth 2 (two) times daily.  . saxagliptin HCl (ONGLYZA) 5 MG TABS tablet Take 1 tablet (5 mg total) by mouth daily. (Patient not taking: Reported on 01/30/2019)  . traMADol (ULTRAM) 50 MG tablet Take 1 tablet (50 mg total) by mouth 2 (two) times daily.  . traZODone (DESYREL) 50 MG tablet Take one 2 tabs at night for sleep (Patient not taking: Reported on 01/30/2019)  . zolpidem (AMBIEN) 10 MG tablet Take 1 tablet (10 mg total) by mouth at bedtime as needed for sleep.   No facility-administered encounter medications on file as of 02/07/2019.    Surgical History: Past Surgical History:  Procedure Laterality Date  . CHOLECYSTECTOMY N/A 11/20/2015   Procedure: LAPAROSCOPIC CHOLECYSTECTOMY WITH INTRAOPERATIVE CHOLANGIOGRAM;  Surgeon: Christene Lye, MD;  Location: [facility] ORS;  Service: General;  Laterality: N/A;  . COLONOSCOPY  2000  . coronary stents     x 3  . EYE SURGERY    . LEFT HEART CATHETERIZATION WITH CORONARY ANGIOGRAM N/A 11/27/2012   Procedure: LEFT HEART CATHETERIZATION WITH CORONARY ANGIOGRAM;  Surgeon: Clent Demark, MD;  Location: [facility] CATH LAB;  Service: Cardiovascular;  Laterality: N/A;  . MINOR HEMORRHOIDECTOMY      Medical History: Past Medical History:  Diagnosis Date  . Asthma   . Coronary artery disease   . Depression   . Diabetes mellitus without complication ([place])   . Hyperlipidemia   . Hypertension   . Hypothyroidism      Family History: Family History  Problem Relation Age of Onset  . Diabetes Neg Hx   . CAD Neg Hx   . Cancer Neg Hx     Social History   Socioeconomic History  . Marital status: Widowed    Spouse name: Not on file  . Number of children: Not on file  . Years of education: Not on file  . Highest education level: Not on file  Occupational History  . Not on file  Tobacco Use  . Smoking status: Never Smoker  . Smokeless tobacco: Never Used  Substance and Sexual Activity  . Alcohol use: No  . Drug use: No  . Sexual activity: Not on file  Other Topics Concern  . Not on file  Social History Narrative  . Not on file   Social Determinants of Health   Financial Resource Strain:   .  Difficulty of Paying Living Expenses: Not on file  Food Insecurity:   . Worried About Charity fundraiser in the Last Year: Not on file  . Ran Out of Food in the Last Year: Not on file  Transportation Needs:   . Lack of Transportation (Medical): Not on file  . Lack of Transportation (Non-Medical): Not on file  Physical Activity:   . Days of Exercise per Week: Not on file  . Minutes of Exercise per Session: Not on file  Stress:   . Feeling of Stress : Not on file  Social Connections:   . Frequency of Communication with Friends and Family: Not on file  . Frequency of Social Gatherings with Friends and Family: Not on file  . Attends Religious Services: Not on file  . Active Member of Clubs or Organizations: Not on file  . Attends Archivist Meetings: Not on file  . Marital Status: Not on file  Intimate Partner Violence:   . Fear of Current or Ex-Partner: Not on file  . Emotionally Abused: Not on file  . Physically Abused: Not on file  . Sexually Abused: Not on file   Review of Systems  Constitutional: Negative for chills, diaphoresis and fatigue.  HENT: Negative for ear pain, postnasal drip and sinus pressure.   Eyes: Negative for photophobia, discharge, redness, itching and  visual disturbance.  Respiratory: Negative for cough, shortness of breath and wheezing.   Cardiovascular: Negative for chest pain, palpitations and leg swelling.  Gastrointestinal: Negative for abdominal pain, constipation, diarrhea, nausea and vomiting.  Genitourinary: Negative for dysuria and flank pain.  Musculoskeletal: Negative for arthralgias, back pain, gait problem and neck pain.  Skin: Negative for color change.  Allergic/Immunologic: Negative for environmental allergies and food allergies.  Neurological: Negative for dizziness and headaches.  Hematological: Does not bruise/bleed easily.  Psychiatric/Behavioral: Negative for agitation, behavioral problems (depression) and hallucinations.   Vital Signs: BP (!) 150/70   Pulse 66   Wt 145 lb (65.8 kg)   SpO2 92%   BMI 24.89 kg/m   Physical Exam Constitutional:      General: She is not in acute distress.    Appearance: She is well-developed. She is not diaphoretic.  HENT:     Head: Normocephalic and atraumatic.     Mouth/Throat:     Pharynx: No oropharyngeal exudate.  Eyes:     Pupils: Pupils are equal, round, and reactive to light.  Neck:     Thyroid: No thyromegaly.     Vascular: No JVD.     Trachea: No tracheal deviation.  Cardiovascular:     Rate and Rhythm: Normal rate and regular rhythm.     Heart sounds: Normal heart sounds. No murmur. No friction rub. No gallop.   Pulmonary:     Effort: Pulmonary effort is normal. No respiratory distress.     Breath sounds: No wheezing or rales.  Chest:     Chest wall: No tenderness.  Abdominal:     General: Bowel sounds are normal.     Palpations: Abdomen is soft.  Musculoskeletal:        General: Normal range of motion.     Cervical back: Normal range of motion and neck supple.  Lymphadenopathy:     Cervical: No cervical adenopathy.  Skin:    General: Skin is warm and dry.  Neurological:     Mental Status: She is alert and oriented to person, place, and time.      Cranial Nerves:  No cranial nerve deficit.  Psychiatric:        Behavior: Behavior normal.        Thought Content: Thought content normal.        Judgment: Judgment normal.    Assessment/Plan: 1. 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 Uncontrolled type 2 diabetes mellitus with hyperglycemia (HCC) - Continue Amaryl 2 mg po bid, Basalgar 18- 20 units in am, 16 units in pm  3. Spinal stenosis at L4-L5 level - Will need MRI LS spine   4. Hypoxia - Continue O2 as before  5. Community acquired pneumonia of right middle lobe of lung - Finish Augmentin 875 mg bid   General Counseling: Joanny verbalizes understanding of the findings of todays visit and agrees with plan of treatment. I have discussed any further diagnostic evaluation that may be needed or ordered today. We also reviewed her medications today. she has been encouraged to call the office with any questions or concerns that should arise related to todays visit.  I have reviewed all medical records from hospital follow up including radiology reports and consults from other physicians. Appropriate follow up diagnostics will be scheduled as needed. Patient/ Family understands the plan of treatment. Time spent 35 minutes.   Dr Lavera Guise, MD Internal Medicine

## 2019-02-19 ENCOUNTER — Other Ambulatory Visit: Payer: Self-pay

## 2019-02-19 MED ORDER — MICROLET LANCETS MISC: 3 refills | Status: DC

## 2019-03-05 LAB — FUNGUS CULTURE RESULT

## 2019-03-05 LAB — FUNGAL ORGANISM REFLEX

## 2019-03-05 LAB — FUNGUS CULTURE WITH STAIN

## 2019-03-11 ENCOUNTER — Ambulatory Visit: Admission: RE | Admit: 2019-03-11 | Payer: Medicare Other | Source: Ambulatory Visit

## 2019-03-18 ENCOUNTER — Ambulatory Visit: Admission: RE | Admit: 2019-03-18 | Payer: Medicare Other | Source: Ambulatory Visit

## 2019-03-22 ENCOUNTER — Other Ambulatory Visit: Payer: Self-pay | Admitting: Nurse Practitioner

## 2019-03-22 DIAGNOSIS — F5102 Adjustment insomnia: Secondary | ICD-10-CM

## 2019-03-22 LAB — ACID FAST CULTURE WITH REFLEXED SENSITIVITIES (MYCOBACTERIA): Acid Fast Culture: NEGATIVE

## 2019-03-22 MED ORDER — QUETIAPINE FUMARATE 25 MG PO TABS: 25.0000 mg | ORAL_TABLET | Freq: Every day | ORAL | 3 refills | Status: DC

## 2019-03-22 NOTE — Progress Notes (Signed)
Sent seroquel 25mg  at bedtime as needed for insomnia/anxiety [facility].

## 2019-03-28 ENCOUNTER — Ambulatory Visit
Admission: RE | Admit: 2019-03-28 | Discharge: 2019-03-28 | Disposition: A | Discharge status: Home / Self Care | Payer: Medicare Other | Source: Ambulatory Visit | Attending: Internal Medicine | Admitting: Internal Medicine

## 2019-03-28 ENCOUNTER — Other Ambulatory Visit: Payer: Self-pay

## 2019-03-28 DIAGNOSIS — M48061 Spinal stenosis, lumbar region without neurogenic claudication: Secondary | ICD-10-CM | POA: Insufficient documentation

## 2019-03-28 DIAGNOSIS — R0902 Hypoxemia: Secondary | ICD-10-CM | POA: Diagnosis present

## 2019-03-28 DIAGNOSIS — R0689 Other abnormalities of breathing: Secondary | ICD-10-CM | POA: Diagnosis present

## 2019-03-28 DIAGNOSIS — F324 Major depressive disorder, single episode, in partial remission: Secondary | ICD-10-CM

## 2019-03-28 DIAGNOSIS — R0600 Dyspnea, unspecified: Secondary | ICD-10-CM | POA: Insufficient documentation

## 2019-03-28 NOTE — Progress Notes (Signed)
Do you think she can be seen by neurosurgery// pain to discuss some options

## 2019-04-01 ENCOUNTER — Encounter: Payer: Self-pay | Admitting: Student in an Organized Health Care Education/Training Program

## 2019-04-01 NOTE — Progress Notes (Signed)
Thanks

## 2019-04-01 NOTE — Progress Notes (Signed)
Phone interview conducted for tomorrows visit, spoke with Cedric Fishman, patient's daughter.  She will be the contact for tomorrows visit.

## 2019-04-01 NOTE — Progress Notes (Signed)
Patient: Evelyn Stark  Service Category: E/M  Provider: Gillis Santa, MD  DOB: April 07, 1940  DOS: 04/02/2019  Location: Office  MRN: [id number]  Setting: Ambulatory outpatient  Referring Provider: Allyne Gee, MD  Type: New Patient  Specialty: Interventional Pain Management  PCP: Ronnell Freshwater, NP  Location: Home  Delivery: TeleHealth     Virtual Encounter - Pain Management PROVIDER NOTE: Information contained herein reflects review and annotations entered in association with encounter. Interpretation of such information and data should be left to medically-trained personnel. Information provided to patient can be located elsewhere in the medical record under "Patient Instructions". Document created using STT-dictation technology, any transcriptional errors that may result from process are unintentional.    Contact & Pharmacy Preferred: [phone] Home: [phone] (home) Mobile: [phone] (mobile) E-mail: No e-mail address on record  [place], [place] [address] [place] [address] Phone: [phone] Fax: [phone]  [id number] [id number]: [phone]: [phone]  Pre-screening note:  Our staff contacted Ms. KBelloand offered her an "in person", "face-to-face" appointment versus a telephone encounter. She indicated preferring the telephone encounter, at this time.  Primary Reason(s) for Visit: Tele-Encounter for initial evaluation of one or more chronic problems (new to examiner) potentially causing chronic pain, and posing a threat to normal musculoskeletal function. (Level of risk: High) CC: Back Pain (bilateral left is worse )  I contacted Evelyn Stark 04/02/2019 via telephone.      I clearly identified myself as BGillis Santa MD. I verified that I was speaking with the correct person using two identifiers (Name: Evelyn Stark and date of birth:  505/31/1942.  This visit was completed via telephone due to the restrictions of the COVID-19 pandemic. All issues as above were discussed and addressed but no physical exam was performed. If it was felt that the patient should be evaluated in the office, they were directed there. The patient verbally consented to this visit. Patient was unable to complete an audio/visual visit due to Technical difficulties and/or Lack of internet. Due to the catastrophic nature of the COVID-19 pandemic, this visit was done through audio contact only.  Location of the patient: home address (see Epic for details)  Location of the provider: office  Advanced Informed Consent I sought verbal advanced consent from Evelyn Belfastfor virtual visit interactions. I informed Ms. KBondof possible security and privacy concerns, risks, and limitations associated with providing "not-in-person" medical evaluation and management services. I also informed Ms. KFilkinsof the availability of "in-person" appointments. Finally, I informed her that there would be a charge for the virtual visit and that she could be  personally, fully or partially, financially responsible for it. Ms. KZurnexpressed understanding and agreed to proceed.   HPI  Ms. KFreezeis a 79y.o. year old, female patient, contacted today for an initial evaluation of her chronic pain. 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   Onset and Duration: Gradual and Present longer than 3 months Cause of pain: Unknown Severity: Getting worse and NAS-11 on the average: 8/10 Timing:  During  activity or exercise and After activity or exercise Aggravating Factors: Bending, Motion and Walking Alleviating Factors: Lying down, Medications and Resting Associated Problems: Weakness Quality of Pain: Constant, Dull and Getting longer Previous Examinations or Tests: MRI scan Previous Treatments: Narcotic medications and Physical Therapy   79 year old female who presents with a chief complaint of left low back pain as well as left hip pain and proximal lateral thigh pain.  Patient also has significant difficulty walking and develops lower extremity weakness with prolonged standing or walking.  She has tried physical therapy in the past as well as multiple medication trials which have not been beneficial.  Neither the family nor the patient are interested in surgical therapy.  She is referred as a fast-track for diagnostic lumbar epidural steroid injection.  Of note majority of the information above regarding HPI was obtained via the patient's daughter, Dr. Chancy Milroy  Meds   Current Outpatient Medications:  .  ALPRAZolam (XANAX) 0.25 MG tablet, Take 1 tablet (0.25 mg total) by mouth 2 (two) times daily as needed for sleep or anxiety., Disp: 120 tablet, Rfl: 1 .  amLODipine (NORVASC) 10 MG tablet, Take 1 tablet (10 mg total) by mouth 2 (two) times daily., Disp: 180 tablet, Rfl: 3 .  aspirin EC 81 MG tablet, Take 81 mg by mouth daily., Disp: , Rfl:  .  atorvastatin (LIPITOR) 40 MG tablet, Take 0.5 tablets (20 mg total) by mouth daily., Disp: 90 tablet, Rfl: 3 .  Blood Glucose Monitoring Suppl (CONTOUR BLOOD GLUCOSE SYSTEM) w/Device KIT, Use as directed diag E11.65, Disp: 1 each, Rfl: 0 .  Blood Glucose Monitoring Suppl (CONTOUR NEXT MONITOR) w/Device KIT, 1 each by Does not apply route daily. Use as directed Diag E11.65, Disp: 1 kit, Rfl: 0 .  budesonide-formoterol (SYMBICORT) 160-4.5 MCG/ACT inhaler, Inhale 2 puffs into the lungs 2 (two) times daily as needed., Disp: 3 Inhaler, Rfl: 3 .   escitalopram (LEXAPRO) 20 MG tablet, Take one tab po qd for depression, Disp: 90 tablet, Rfl: 3 .  esomeprazole (NEXIUM) 40 MG capsule, Take 1 capsule (40 mg total) by mouth daily., Disp: 90 capsule, Rfl: 3 .  furosemide (LASIX) 20 MG tablet, Take 1 tablet (20 mg total) by mouth daily., Disp: 90 tablet, Rfl: 1 .  gabapentin (NEURONTIN) 100 MG capsule, Take 1 capsule (100 mg total) by mouth 3 (three) times daily., Disp: 270 capsule, Rfl: 1 .  glimepiride (AMARYL) 2 MG tablet, Take 1 tablet (2 mg total) by mouth 2 (two) times daily., Disp: 180 tablet, Rfl: 1 .  glucose blood (CONTOUR NEXT TEST) test strip, Use as instructed three times a daily diag E11.65, Disp: 100 each, Rfl: 3 .  glucose blood (CONTOUR TEST) test strip, 1 each by Other route 3 (three) times daily. diag e11.65, Disp: 300 each, Rfl: 3 .  hydrALAZINE (APRESOLINE) 100 MG tablet, Take 1 tablet (100 mg total) by mouth 3 (three) times daily., Disp: 270 tablet, Rfl: 1 .  Insulin Glargine (BASAGLAR KWIKPEN) 100 UNIT/ML SOPN, Use 20-25 units with supper qd (Patient taking differently: Inject 8-12 Units into the skin 2 (two) times daily. Inject 8 units daily in the morning and 12 units daily in the evening), Disp: 3 pen, Rfl: 3 .  Insulin Pen Needle (BD PEN NEEDLE NANO U/F) 32G X 4 MM MISC, Use as directed with insulin, Disp: 200 each, Rfl: 5 .  levothyroxine (SYNTHROID) 137 MCG tablet, Take 1 tablet (137 mcg total) by mouth daily before breakfast., Disp:  90 tablet, Rfl: 3 .  Microlet Lancets MISC, Use as directed twice daily E11.65, Disp: 100 each, Rfl: 3 .  Nebivolol HCl (BYSTOLIC) 20 MG TABS, Take one tab po bid (Patient taking differently: Take 20 mg by mouth daily. ), Disp: 180 tablet, Rfl: 3 .  traMADol (ULTRAM) 50 MG tablet, Take 1 tablet (50 mg total) by mouth 2 (two) times daily., Disp: 180 tablet, Rfl: 1 .  canagliflozin (INVOKANA) 300 MG TABS tablet, Take 1 tablet (300 mg total) by mouth daily before breakfast., Disp: 90 tablet, Rfl:  3 .  levofloxacin (LEVAQUIN) 500 MG tablet, Take 1 tablet (500 mg total) by mouth daily., Disp: 10 tablet, Rfl: 0 .  mirtazapine (REMERON) 7.5 MG tablet, Take one tab po qhs for insomnia, Disp: 90 tablet, Rfl: 3 .  mometasone-formoterol (DULERA) 100-5 MCG/ACT AERO, Inhale 2 puffs into the lungs 2 (two) times daily., Disp: 18 g, Rfl: 1 .  nystatin (MYCOSTATIN) 100000 UNIT/ML suspension, Take 5 mLs (500,000 Units total) by mouth 4 (four) times daily., Disp: 120 mL, Rfl: 1 .  oxyCODONE (OXY IR/ROXICODONE) 5 MG immediate release tablet, Take 1 tablet (5 mg total) by mouth every 4 (four) hours as needed for severe pain., Disp: 20 tablet, Rfl: 0 .  pimecrolimus (ELIDEL) 1 % cream, Apply topically 2 (two) times daily., Disp: 30 g, Rfl: 2 .  Plecanatide (TRULANCE) 3 MG TABS, Take 3 mg by mouth daily., Disp: 90 tablet, Rfl: 3 .  QUEtiapine (SEROQUEL) 25 MG tablet, Take 1 tablet (25 mg total) by mouth at bedtime., Disp: 30 tablet, Rfl: 3 .  rOPINIRole (REQUIP) 0.25 MG tablet, Take 1 tablet (0.25 mg total) by mouth 2 (two) times daily., Disp: 180 tablet, Rfl: 1 .  saxagliptin HCl (ONGLYZA) 5 MG TABS tablet, Take 1 tablet (5 mg total) by mouth daily., Disp: 90 tablet, Rfl: 2 .  traZODone (DESYREL) 50 MG tablet, Take one 2 tabs at night for sleep, Disp: 180 tablet, Rfl: 3 .  zolpidem (AMBIEN) 10 MG tablet, Take 1 tablet (10 mg total) by mouth at bedtime as needed for sleep., Disp: 90 tablet, Rfl: 1  ROS  Cardiovascular: Heart trouble, High blood pressure and Heart catheterization Pulmonary or Respiratory: Wheezing and difficulty taking a deep full breath (Asthma) Neurological: No reported neurological signs or symptoms such as seizures, abnormal skin sensations, urinary and/or fecal incontinence, being born with an abnormal open spine and/or a tethered spinal cord Psychological-Psychiatric: Depressed Gastrointestinal: No reported gastrointestinal signs or symptoms such as vomiting or evacuating blood, reflux,  heartburn, alternating episodes of diarrhea and constipation, inflamed or scarred liver, or pancreas or irrregular and/or infrequent bowel movements Genitourinary: No reported renal or genitourinary signs or symptoms such as difficulty voiding or producing urine, peeing blood, non-functioning kidney, kidney stones, difficulty emptying the bladder, difficulty controlling the flow of urine, or chronic kidney disease Hematological: No reported hematological signs or symptoms such as prolonged bleeding, low or poor functioning platelets, bruising or bleeding easily, hereditary bleeding problems, low energy levels due to low hemoglobin or being anemic Endocrine: High blood sugar requiring insulin (IDDM) and Slow thyroid Rheumatologic: No reported rheumatological signs and symptoms such as fatigue, joint pain, tenderness, swelling, redness, heat, stiffness, decreased range of motion, with or without associated rash Musculoskeletal: Negative for myasthenia gravis, muscular dystrophy, multiple sclerosis or malignant hyperthermia Work History: Retired  Allergies  Evelyn Stark has No Known Allergies.  Laboratory Chemistry Profile   Screening Lab Results  Component Value Date   SARSCOV2NAA  NEGATIVE 01/30/2019    Inflammation (CRP: Acute Phase) (ESR: Chronic Phase) No results found for: CRP, ESRSEDRATE, LATICACIDVEN                       Rheumatology Lab Results  Component Value Date   ANA Positive (A) 01/01/2019                        Renal Lab Results  Component Value Date   BUN 27 (H) 02/01/2019   CREATININE 1.31 (H) 02/01/2019   BCR 14 01/01/2019   GFRAA 44 (L) 02/01/2019   GFRNONAA 38 (L) 02/01/2019                             Hepatic Lab Results  Component Value Date   AST 9 (L) 01/31/2019   ALT 15 01/31/2019   ALBUMIN 3.3 (L) 01/31/2019   ALKPHOS 80 01/31/2019                        Electrolytes Lab Results  Component Value Date   NA 137 02/01/2019   K 4.2 02/01/2019   CL  103 02/01/2019   CALCIUM 8.7 (L) 02/01/2019   MG 1.8 01/31/2019   PHOS 5.1 (H) 01/31/2019                        Neuropathy Lab Results  Component Value Date   VITAMINB12 772 01/01/2019   FOLATE 18.8 01/01/2019   HGBA1C 7.9 (H) 01/31/2019                        Coagulation Lab Results  Component Value Date   PLT 319 01/31/2019                        Cardiovascular Lab Results  Component Value Date   BNP 709.0 (H) 02/01/2019   HGB 11.7 (L) 01/31/2019   HCT 37.7 01/31/2019                         ID Lab Results  Component Value Date   SARSCOV2NAA NEGATIVE 01/30/2019   MICROTEXT  10/17/2012       SOURCE: BRONCH WASHING    COMMENT                   NO YEAST OR FUNGUS ISOLATED IN 21 DAYS   ANTIBIOTIC                                                       MICROTEXT  10/17/2012       SOURCE: LT LUNGULA BRONCH    ORGANISM 1                MODERATE GROWTH STAPHYLOCOCCUS AUREUS   ORGANISM 2                RARE GROWTH HAEMOPHILUS PARAHAEMOLYTICUS   COMMENT                   -   GRAM STAIN                POOR  SPECIMEN-LESS THAN 70% WBC   GRAM STAIN                RARE WHITE BLOOD CELLS   GRAM STAIN                FEW RED BLOOD CELLS   GRAM STAIN                NO ORGANISMS SEEN   ANTIBIOTIC                    ORG#1    ORG#2     CIPROFLOXACIN                 R                  CLINDAMYCIN                   R                  ERYTHROMYCIN                  R                  GENTAMICIN                    S                  LEVOFLOXACIN                  I                  LINEZOLID                     S                  OXACILLIN                     S                  TIGECYCLINE                   S                  CEFOXITIN SCREEN              NEGATIVE           INDUCIBLE CLINDAMYCIN RESISTANPOSITIVE           TRIMETHOPRIM/SULFAMETHOXAZOLE R                  BETA-LACTAMASE                         POSITIVE      Cancer No results found for: CEA, CA125, LABCA2                       Endocrine Lab Results  Component Value Date   TSH 3.678 01/31/2019   FREET4 1.54 01/01/2019                        Note: Lab results reviewed.  Imaging Review    Lumbosacral Imaging: Lumbar MR wo contrast:  Results for orders placed during the hospital encounter of 03/28/19  MR Lumbar Spine Wo Contrast   Narrative CLINICAL DATA:  Bilateral thigh  pain, no known injury  EXAM: MRI LUMBAR SPINE WITHOUT CONTRAST  TECHNIQUE: Multiplanar, multisequence MR imaging of the lumbar spine was performed. No intravenous contrast was administered.  COMPARISON:  10/26/2012  FINDINGS: Segmentation:  Standard.  Alignment:  Grade 1 anterolisthesis of L3 on L4 and L5 on S1.  Vertebrae:  No fracture, evidence of discitis, or bone lesion.  Conus medullaris and cauda equina: Conus extends to the L1 level. Conus and cauda equina appear normal.  Paraspinal and other soft tissues: No acute paraspinal abnormality.  Disc levels:  Disc spaces: Lumbar spine degenerative disease with disc height loss at L3-4 and L4-5.  T12-L1: No significant disc bulge. No evidence of neural foraminal stenosis. No central canal stenosis. Mild bilateral facet arthropathy.  L1-L2: Mild broad-based disc bulge with a tiny right paracentral disc protrusion. Mild bilateral facet arthropathy. No evidence of neural foraminal stenosis. No central canal stenosis.  L2-L3: Mild broad-based disc bulge. Mild bilateral facet arthropathy with a right facet effusion. No evidence of neural foraminal stenosis. No central canal stenosis.  L3-L4: Mild bilateral foraminal stenosis. Broad-based disc osteophyte complex flattening ventral thecal sac. Left paracentral disc protrusion with severe mass effect on the left intraspinal L4 nerve root. Moderate bilateral facet arthropathy. Severe spinal stenosis.  L4-L5: Broad-based disc osteophyte complex flattening the ventral thecal sac. Moderate bilateral facet  arthropathy. Mild spinal stenosis. Moderate right and mild left foraminal stenosis.  L5-S1: Broad-based disc bulge. Severe bilateral facet arthropathy. Mild bilateral foraminal narrowing. No central canal stenosis.  IMPRESSION: 1. Diffuse lumbar spine spondylosis as described above. 2. At L3-4 there is broad-based disc osteophyte complex flattening ventral thecal sac. Left paracentral disc protrusion with severe mass effect on the left intraspinal L4 nerve root. Moderate bilateral facet arthropathy. Severe spinal stenosis. Mild bilateral foraminal stenosis.   Electronically Signed   By: Kathreen Devoid   On: 03/28/2019 12:17      Complexity Note: Imaging results reviewed. Results shared with Ms. Sylvan, using Layman's terms.                         [place]  Drug: Evelyn Stark  reports no history of drug use. Alcohol:  reports no history of alcohol use. Tobacco:  reports that she has never smoked. She has never used smokeless tobacco. Medical:  has a past medical history of Asthma, Coronary artery disease, Depression, Diabetes mellitus without complication ([place]), Hyperlipidemia, Hypertension, and Hypothyroidism. Family: family history is not on file.  Past Surgical History:  Procedure Laterality Date  . CHOLECYSTECTOMY N/A 11/20/2015   Procedure: LAPAROSCOPIC CHOLECYSTECTOMY WITH INTRAOPERATIVE CHOLANGIOGRAM;  Surgeon: Christene Lye, MD;  Location: [facility] ORS;  Service: General;  Laterality: N/A;  . COLONOSCOPY  2000  . coronary stents     x 3  . EYE SURGERY    . LEFT HEART CATHETERIZATION WITH CORONARY ANGIOGRAM N/A 11/27/2012   Procedure: LEFT HEART CATHETERIZATION WITH CORONARY ANGIOGRAM;  Surgeon: Clent Demark, MD;  Location: [facility] CATH LAB;  Service: Cardiovascular;  Laterality: N/A;  . MINOR HEMORRHOIDECTOMY     Active Ambulatory Problems    Diagnosis Date Noted  . CHF exacerbation ([place]) 01/30/2019  . Essential hypertension 01/30/2019  . CAD (coronary artery disease)  01/30/2019  . CKD (chronic kidney disease), stage III 01/30/2019  . Asthma 01/30/2019  . DM (diabetes mellitus), type 2 with complications ([place]) 89/38/1017  . Pleural effusion 01/31/2019  . CAP (community acquired pneumonia) 01/31/2019  . Lumbosacral radiculopathy at L4  04/02/2019  . Chronic radicular lumbar pain 04/02/2019  . Spinal stenosis, lumbar region, with neurogenic claudication 04/02/2019  . 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 No Resolved Ambulatory Problems   Past Medical History:  Diagnosis Date  . Coronary artery disease   . Depression   . Diabetes mellitus without complication ([place])   . Hyperlipidemia   . Hypertension   . Hypothyroidism    Assessment  Primary Diagnosis & Pertinent Problem List: The primary encounter diagnosis was Lumbar radiculopathy. 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  Visit Diagnosis (New problems to examiner): 1. Lumbar radiculopathy   2. Lumbosacral radiculopathy at L4   3. Chronic radicular lumbar pain   4. Spinal stenosis, lumbar region, with neurogenic claudication   5. 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 Lumbar radiculopathy - Lumbar Epidural Injection; Future  2. Lumbosacral radiculopathy at L4 - Lumbar Epidural Injection; Future  3. Chronic radicular lumbar pain -L-ESI  4. Spinal stenosis, lumbar region, with neurogenic claudication -s/p PT -L-ESI  5. 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  No future appointments. Total  duration of encounter: 30 minutes.  Primary Care Physician: Ronnell Freshwater, NP Note by: Gillis Santa, MD Date: 04/02/2019; Time: 3:19 PM

## 2019-04-02 ENCOUNTER — Ambulatory Visit (INDEPENDENT_AMBULATORY_CARE_PROVIDER_SITE_OTHER): Payer: Medicare Other | Admitting: Internal Medicine

## 2019-04-02 ENCOUNTER — Ambulatory Visit
Payer: Medicare Other | Attending: Student in an Organized Health Care Education/Training Program | Admitting: Student in an Organized Health Care Education/Training Program

## 2019-04-02 ENCOUNTER — Encounter: Payer: Self-pay | Admitting: Student in an Organized Health Care Education/Training Program

## 2019-04-02 ENCOUNTER — Encounter: Payer: Self-pay | Admitting: Internal Medicine

## 2019-04-02 ENCOUNTER — Other Ambulatory Visit: Payer: Self-pay

## 2019-04-02 VITALS — Ht 63.0 in | Wt 145.0 lb

## 2019-04-02 DIAGNOSIS — G8929 Other chronic pain: Secondary | ICD-10-CM | POA: Diagnosis not present

## 2019-04-02 DIAGNOSIS — I10 Essential (primary) hypertension: Secondary | ICD-10-CM

## 2019-04-02 DIAGNOSIS — M150 Primary generalized (osteo)arthritis: Secondary | ICD-10-CM

## 2019-04-02 DIAGNOSIS — M5417 Radiculopathy, lumbosacral region: Secondary | ICD-10-CM | POA: Diagnosis not present

## 2019-04-02 DIAGNOSIS — M48061 Spinal stenosis, lumbar region without neurogenic claudication: Secondary | ICD-10-CM | POA: Diagnosis not present

## 2019-04-02 DIAGNOSIS — M48062 Spinal stenosis, lumbar region with neurogenic claudication: Secondary | ICD-10-CM | POA: Insufficient documentation

## 2019-04-02 DIAGNOSIS — M5416 Radiculopathy, lumbar region: Secondary | ICD-10-CM | POA: Diagnosis not present

## 2019-04-02 DIAGNOSIS — F324 Major depressive disorder, single episode, in partial remission: Secondary | ICD-10-CM

## 2019-04-02 DIAGNOSIS — M8949 Other hypertrophic osteoarthropathy, multiple sites: Secondary | ICD-10-CM | POA: Diagnosis not present

## 2019-04-02 DIAGNOSIS — E039 Hypothyroidism, unspecified: Secondary | ICD-10-CM

## 2019-04-02 DIAGNOSIS — M159 Polyosteoarthritis, unspecified: Secondary | ICD-10-CM

## 2019-04-02 DIAGNOSIS — E1165 Type 2 diabetes mellitus with hyperglycemia: Secondary | ICD-10-CM

## 2019-04-03 ENCOUNTER — Telehealth: Payer: Self-pay

## 2019-04-03 ENCOUNTER — Encounter: Payer: Self-pay | Admitting: Internal Medicine

## 2019-04-03 MED ORDER — BYSTOLIC 20 MG PO TABS: 20.0000 mg | ORAL_TABLET | Freq: Every day | ORAL | 3 refills | Status: DC

## 2019-04-03 MED ORDER — BASAGLAR KWIKPEN 100 UNIT/ML ~~LOC~~ SOPN: PEN_INJECTOR | SUBCUTANEOUS | 3 refills | Status: DC

## 2019-04-03 NOTE — Telephone Encounter (Signed)
Need referral for out pt physical therapy

## 2019-04-03 NOTE — Progress Notes (Signed)
[facility] [place], [place] [address]  Internal MEDICINE  Office Visit Note  Patient Name: Evelyn Stark  [id number]  [id number]  Date of Service: 04/03/2019  Chief Complaint  Patient presents with  . Telephone Assessment  . Telephone Screen  . Follow-up    home health referral for physical therapy    Pt is connected via telephone/ video to prevent risks of infection due to pandemic associated with covid 19 HPI Pt is reporting worsening back pain, Her MRI results are as follows 1. Diffuse lumbar spine spondylosis as described above. 2. At L3-4 there is broad-based disc osteophyte complex flattening ventral thecal sac. Left paracentral disc protrusion with severe mass effect on the left intraspinal L4 nerve root. Moderate bilateral facet arthropathy. Severe spinal stenosis. Mild bilateral foraminal stenosis. Pt will see pain management on urgent basis. She has tried to see chiropractor for few times with no relief, PT has not helped her in the past. However she is willing to try after seen by pain management for possible epidural. Diabetes is under good control, appetite continues to be poor but improving Depression and sleep is better, she is on Lexapro and Remeron ILD is stable, she has not required O2 since her discharge from the hospital CAD stable, recent echo is normal  Current Medication: Outpatient Encounter Medications as of 04/02/2019  Medication Sig  . ALPRAZolam (XANAX) 0.25 MG tablet Take 1 tablet (0.25 mg total) by mouth 2 (two) times daily as needed for sleep or anxiety.  Marland Kitchen amLODipine (NORVASC) 10 MG tablet Take 1 tablet (10 mg total) by mouth 2 (two) times daily.  Marland Kitchen aspirin EC 81 MG tablet Take 81 mg by mouth daily.  Marland Kitchen atorvastatin (LIPITOR) 40 MG tablet Take 0.5 tablets (20 mg total) by mouth daily.  . Blood Glucose Monitoring Suppl (CONTOUR BLOOD GLUCOSE SYSTEM) w/Device KIT Use as directed diag E11.65  . Blood Glucose Monitoring Suppl  (CONTOUR NEXT MONITOR) w/Device KIT 1 each by Does not apply route daily. Use as directed Diag E11.65  . budesonide-formoterol (SYMBICORT) 160-4.5 MCG/ACT inhaler Inhale 2 puffs into the lungs 2 (two) times daily as needed.  . canagliflozin (INVOKANA) 300 MG TABS tablet Take 1 tablet (300 mg total) by mouth daily before breakfast.  . escitalopram (LEXAPRO) 20 MG tablet Take one tab po qd for depression  . esomeprazole (NEXIUM) 40 MG capsule Take 1 capsule (40 mg total) by mouth daily.  . furosemide (LASIX) 20 MG tablet Take 1 tablet (20 mg total) by mouth daily.  Marland Kitchen gabapentin (NEURONTIN) 100 MG capsule Take 1 capsule (100 mg total) by mouth 3 (three) times daily.  Marland Kitchen glimepiride (AMARYL) 2 MG tablet Take 1 tablet (2 mg total) by mouth 2 (two) times daily.  Marland Kitchen glucose blood (CONTOUR NEXT TEST) test strip Use as instructed three times a daily diag E11.65  . glucose blood (CONTOUR TEST) test strip 1 each by Other route 3 (three) times daily. diag e11.65  . hydrALAZINE (APRESOLINE) 100 MG tablet Take 1 tablet (100 mg total) by mouth 3 (three) times daily.  . Insulin Pen Needle (BD PEN NEEDLE NANO U/F) 32G X 4 MM MISC Use as directed with insulin  . levothyroxine (SYNTHROID) 137 MCG tablet Take 1 tablet (137 mcg total) by mouth daily before breakfast.  . Microlet Lancets MISC Use as directed twice daily E11.65  . mirtazapine (REMERON) 7.5 MG tablet Take one tab po qhs for insomnia  . mometasone-formoterol (DULERA) 100-5 MCG/ACT AERO Inhale  2 puffs into the lungs 2 (two) times daily.  . Nebivolol HCl (BYSTOLIC) 20 MG TABS Take 1 tablet (20 mg total) by mouth daily.  . pimecrolimus (ELIDEL) 1 % cream Apply topically 2 (two) times daily.  Marland Kitchen Plecanatide (TRULANCE) 3 MG TABS Take 3 mg by mouth daily.  . saxagliptin HCl (ONGLYZA) 5 MG TABS tablet Take 1 tablet (5 mg total) by mouth daily.  . traMADol (ULTRAM) 50 MG tablet Take 1 tablet (50 mg total) by mouth 2 (two) times daily.  Marland Kitchen zolpidem (AMBIEN) 10 MG  tablet Take 1 tablet (10 mg total) by mouth at bedtime as needed for sleep.  . [DISCONTINUED] amoxicillin-clavulanate (AUGMENTIN) 875-125 MG tablet Take 1 tablet by mouth every 12 (twelve) hours.  . [DISCONTINUED] azithromycin (ZITHROMAX) 500 MG tablet Take 1 tablet (500 mg total) by mouth daily.  . [DISCONTINUED] Insulin Glargine (BASAGLAR KWIKPEN) 100 UNIT/ML SOPN Use 20-25 units with supper qd (Patient taking differently: Inject 8-12 Units into the skin 2 (two) times daily. Inject 8 units daily in the morning and 12 units daily in the evening)  . [DISCONTINUED] levofloxacin (LEVAQUIN) 500 MG tablet Take 1 tablet (500 mg total) by mouth daily.  . [DISCONTINUED] Nebivolol HCl (BYSTOLIC) 20 MG TABS Take one tab po bid (Patient taking differently: Take 20 mg by mouth daily. )  . [DISCONTINUED] nystatin (MYCOSTATIN) 100000 UNIT/ML suspension Take 5 mLs (500,000 Units total) by mouth 4 (four) times daily.  . [DISCONTINUED] oxyCODONE (OXY IR/ROXICODONE) 5 MG immediate release tablet Take 1 tablet (5 mg total) by mouth every 4 (four) hours as needed for severe pain.  . [DISCONTINUED] QUEtiapine (SEROQUEL) 25 MG tablet Take 1 tablet (25 mg total) by mouth at bedtime.  . [DISCONTINUED] rOPINIRole (REQUIP) 0.25 MG tablet Take 1 tablet (0.25 mg total) by mouth 2 (two) times daily.  . [DISCONTINUED] traZODone (DESYREL) 50 MG tablet Take one 2 tabs at night for sleep  . Insulin Glargine (BASAGLAR KWIKPEN) 100 UNIT/ML SOPN Take 18 units 2 x day with breakfast and supper   No facility-administered encounter medications on file as of 04/02/2019.    Surgical History: Past Surgical History:  Procedure Laterality Date  . CHOLECYSTECTOMY N/A 11/20/2015   Procedure: LAPAROSCOPIC CHOLECYSTECTOMY WITH INTRAOPERATIVE CHOLANGIOGRAM;  Surgeon: Christene Lye, MD;  Location: [facility] ORS;  Service: General;  Laterality: N/A;  . COLONOSCOPY  2000  . coronary stents     x 3  . EYE SURGERY    . LEFT HEART  CATHETERIZATION WITH CORONARY ANGIOGRAM N/A 11/27/2012   Procedure: LEFT HEART CATHETERIZATION WITH CORONARY ANGIOGRAM;  Surgeon: Clent Demark, MD;  Location: [facility] CATH LAB;  Service: Cardiovascular;  Laterality: N/A;  . MINOR HEMORRHOIDECTOMY      Medical History: Past Medical History:  Diagnosis Date  . Asthma   . Coronary artery disease   . Depression   . Diabetes mellitus without complication ([place])   . Hyperlipidemia   . Hypertension   . Hypothyroidism     Family History: Family History  Problem Relation Age of Onset  . Diabetes Neg Hx   . CAD Neg Hx   . Cancer Neg Hx     Social History   Socioeconomic History  . Marital status: Widowed    Spouse name: Not on file  . Number of children: Not on file  . Years of education: Not on file  . Highest education level: Not on file  Occupational History  . Not on file  Tobacco Use  .  Smoking status: Never Smoker  . Smokeless tobacco: Never Used  Substance and Sexual Activity  . Alcohol use: No  . Drug use: No  . Sexual activity: Not on file  Other Topics Concern  . Not on file  Social History Narrative  . Not on file   Social Determinants of Health   Financial Resource Strain:   . Difficulty of Paying Living Expenses: Not on file  Food Insecurity:   . Worried About Charity fundraiser in the Last Year: Not on file  . Ran Out of Food in the Last Year: Not on file  Transportation Needs:   . Lack of Transportation (Medical): Not on file  . Lack of Transportation (Non-Medical): Not on file  Physical Activity:   . Days of Exercise per Week: Not on file  . Minutes of Exercise per Session: Not on file  Stress:   . Feeling of Stress : Not on file  Social Connections:   . Frequency of Communication with Friends and Family: Not on file  . Frequency of Social Gatherings with Friends and Family: Not on file  . Attends Religious Services: Not on file  . Active Member of Clubs or Organizations: Not on file  . Attends  Archivist Meetings: Not on file  . Marital Status: Not on file  Intimate Partner Violence:   . Fear of Current or Ex-Partner: Not on file  . Emotionally Abused: Not on file  . Physically Abused: Not on file  . Sexually Abused: Not on file    Review of Systems  Constitutional: Positive for activity change and fatigue.  HENT: Negative.   Respiratory: Negative for choking and chest tightness.   Cardiovascular: Negative for chest pain and leg swelling.  Gastrointestinal: Negative.   Endocrine: Negative.   Genitourinary: Negative.   Musculoskeletal: Positive for arthralgias, back pain and gait problem.  Neurological: Positive for weakness and numbness.  Psychiatric/Behavioral: Positive for sleep disturbance.    Vital Signs: BP (!) 150/70   Pulse 78   Resp 14   Ht _0  (1.6 m)   Wt 144 lb (65.3 kg)   SpO2 94%   BMI 25.51 kg/m    Physical Exam No exam is performed due to virtual visit, she does look fatigued and in mild to moderate pain     Assessment/Plan: 1. Spinal stenosis at L4-L5 level - Pt will see pain management urgently for possible Epidural to alleviate some pain and improve her quality of life, she is willing to get PT after that - Ambulatory referral to Physical Therapy  2. Primary osteoarthritis involving multiple joints - Continue prn Tylenol - Ambulatory referral to Physical Therapy  3. Uncontrolled type 2 diabetes mellitus with hyperglycemia (HCC) - Improved home readings, will continue Amaryl and Basaglar - Insulin Glargine (BASAGLAR KWIKPEN) 100 UNIT/ML SOPN; Take 18 units 2 x day with breakfast and supper  Dispense: 15 pen; Refill: 3  4. Hypothyroidism, unspecified type - Continue Synthroid as before   5. Depression, major, single episode, in partial remission ([place]) - Improving, will continue Lexapro and increase Remeron to 15 mg po qhs if needed  6. Essential hypertension - Continue Hydralazine 100 mg tid, norvasc 10 mg bid and  bystolic 20 mg po qd  - Nebivolol HCl (BYSTOLIC) 20 MG TABS; Take 1 tablet (20 mg total) by mouth daily.  Dispense: 90 tablet; Refill: 3 General Counseling: Evelyn Stark verbalizes understanding of the findings of todays visit and agrees with plan of treatment. I  have discussed any further diagnostic evaluation that may be needed or ordered today. We also reviewed her medications today. she has been encouraged to call the office with any questions or concerns that should arise related to todays visit.    Orders Placed This Encounter  Procedures  . Ambulatory referral to Physical Therapy    Meds ordered this encounter  Medications  . Nebivolol HCl (BYSTOLIC) 20 MG TABS    Sig: Take 1 tablet (20 mg total) by mouth daily.    Dispense:  90 tablet    Refill:  3  . Insulin Glargine (BASAGLAR KWIKPEN) 100 UNIT/ML SOPN    Sig: Take 18 units 2 x day with breakfast and supper    Dispense:  15 pen    Refill:  3    Total time spent:25 Minutes Time spent includes review of chart, medications, test results, and follow up plan with the patient.   Dr Lavera Guise Internal medicine

## 2019-04-05 ENCOUNTER — Other Ambulatory Visit: Payer: Self-pay

## 2019-04-05 MED ORDER — SULFAMETHOXAZOLE-TRIMETHOPRIM 800-160 MG PO TABS: 1.0000 | ORAL_TABLET | Freq: Two times a day (BID) | ORAL | 0 refills | Status: AC

## 2019-04-08 ENCOUNTER — Other Ambulatory Visit: Payer: Self-pay

## 2019-04-08 ENCOUNTER — Encounter: Payer: Self-pay | Admitting: Student in an Organized Health Care Education/Training Program

## 2019-04-08 ENCOUNTER — Ambulatory Visit
Admission: RE | Admit: 2019-04-08 | Discharge: 2019-04-08 | Disposition: A | Discharge status: Home / Self Care | Payer: Medicare Other | Source: Ambulatory Visit | Attending: Student in an Organized Health Care Education/Training Program | Admitting: Student in an Organized Health Care Education/Training Program

## 2019-04-08 ENCOUNTER — Ambulatory Visit (HOSPITAL_BASED_OUTPATIENT_CLINIC_OR_DEPARTMENT_OTHER): Payer: Medicare Other | Admitting: Student in an Organized Health Care Education/Training Program

## 2019-04-08 VITALS — BP 157/70 | HR 75 | Resp 18 | Ht 63.0 in | Wt 148.0 lb

## 2019-04-08 DIAGNOSIS — M5416 Radiculopathy, lumbar region: Secondary | ICD-10-CM | POA: Insufficient documentation

## 2019-04-08 DIAGNOSIS — M5417 Radiculopathy, lumbosacral region: Secondary | ICD-10-CM | POA: Insufficient documentation

## 2019-04-08 MED ORDER — DEXAMETHASONE SODIUM PHOSPHATE 10 MG/ML IJ SOLN
INTRAMUSCULAR | Status: AC
  Filled 2019-04-08: qty 1

## 2019-04-08 MED ORDER — ROPIVACAINE HCL 2 MG/ML IJ SOLN
INTRAMUSCULAR | Status: AC
  Filled 2019-04-08: qty 10

## 2019-04-08 MED ORDER — LIDOCAINE HCL 2 % IJ SOLN
INTRAMUSCULAR | Status: AC
  Filled 2019-04-08: qty 20

## 2019-04-08 MED ORDER — IOHEXOL 180 MG/ML  SOLN
10.0000 mL | Freq: Once | INTRAMUSCULAR | Status: AC
  Administered 2019-04-08: 10 mL via EPIDURAL
  Filled 2019-04-08: qty 20

## 2019-04-08 MED ORDER — ROPIVACAINE HCL 2 MG/ML IJ SOLN
2.0000 mL | Freq: Once | INTRAMUSCULAR | Status: AC
  Administered 2019-04-08: 10 mL via EPIDURAL

## 2019-04-08 MED ORDER — LIDOCAINE HCL 2 % IJ SOLN
20.0000 mL | Freq: Once | INTRAMUSCULAR | Status: AC
  Administered 2019-04-08: 400 mg

## 2019-04-08 MED ORDER — SODIUM CHLORIDE (PF) 0.9 % IJ SOLN
INTRAMUSCULAR | Status: AC
  Filled 2019-04-08: qty 10

## 2019-04-08 MED ORDER — DEXAMETHASONE SODIUM PHOSPHATE 10 MG/ML IJ SOLN
10.0000 mg | Freq: Once | INTRAMUSCULAR | Status: AC
  Administered 2019-04-08: 10 mg

## 2019-04-08 MED ORDER — SODIUM CHLORIDE 0.9% FLUSH
2.0000 mL | Freq: Once | INTRAVENOUS | Status: AC
  Administered 2019-04-08: 10 mL

## 2019-04-08 NOTE — Patient Instructions (Signed)

## 2019-04-08 NOTE — Progress Notes (Signed)
PROVIDER NOTE: Information contained herein reflects review and annotations entered in association with encounter. Interpretation of such information and data should be left to medically-trained personnel. Information provided to patient can be located elsewhere in the medical record under "Patient Instructions". Document created using STT-dictation technology, any transcriptional errors that may result from process are unintentional.    Patient: Evelyn Stark  Service Category: Procedure  Provider: Edward Jolly, MD  DOB: 22-Aug-1940  DOS: 04/08/2019  Location: [facility]  MRN: [id number]  Setting: Ambulatory - outpatient  Referring Provider: Carlean Jews, NP  Type: Established Patient  Specialty: Interventional Pain Management  PCP: Carlean Jews, NP   Primary Reason for Visit: Interventional Pain Management Treatment. CC: Back Pain (low bilaterally, left is worse)  Procedure:          Anesthesia, Analgesia, Anxiolysis:  Type: Diagnostic Inter-Laminar Epidural Steroid Injection  #1   Region: Lumbar Level: L4-5 Level. Laterality: Left         Type: Local Anesthesia  Local Anesthetic: Lidocaine 1-2%  Position: Prone with head of the table was raised to facilitate breathing.   Indications: 1. Lumbar radiculopathy   2. Lumbosacral radiculopathy at L4    Pain Score: Pre-procedure: 8 /10 Post-procedure: 4 /10   Pre-op Assessment:  Evelyn Stark is a 79 y.o. (year old), female patient, seen today for interventional treatment. She  has a past surgical history that includes left heart catheterization with coronary angiogram (N/A, 11/27/2012); Eye surgery; Minor hemorrhoidectomy; Colonoscopy (2000); coronary stents; and Cholecystectomy (N/A, 11/20/2015). Evelyn Stark has a current medication list which includes the following prescription(s): alprazolam, amlodipine, aspirin ec, atorvastatin, contour blood glucose system, contour next monitor, budesonide-formoterol, canagliflozin,  escitalopram, esomeprazole, furosemide, gabapentin, glimepiride, contour next test, glucose blood, hydralazine, basaglar kwikpen, bd pen needle nano u/f, levothyroxine, microlet lancets, mirtazapine, mometasone-formoterol, bystolic, pimecrolimus, trulance, saxagliptin hcl, sulfamethoxazole-trimethoprim, tramadol, and zolpidem. Her primarily concern today is the Back Pain (low bilaterally, left is worse)  Initial Vital Signs:  Pulse/HCG Rate: 75ECG Heart Rate: 82 Temp:   Resp: 18 BP: (!) 159/53 SpO2: 94 %  BMI: Estimated body mass index is 26.22 kg/m as calculated from the following:   Height as of this encounter: 5\' 3"  (1.6 m).   Weight as of this encounter: 148 lb (67.1 kg).  Risk Assessment: Allergies: Reviewed. She has No Known Allergies.  Allergy Precautions: None required Coagulopathies: Reviewed. None identified.  Blood-thinner therapy: None at this time Active Infection(s): Reviewed. None identified. Evelyn Stark is afebrile  Site Confirmation: Evelyn Stark was asked to confirm the procedure and laterality before marking the site Procedure checklist: Completed Consent: Before the procedure and under the influence of no sedative(s), amnesic(s), or anxiolytics, the patient was informed of the treatment options, risks and possible complications. To fulfill our ethical and legal obligations, as recommended by the American Medical Association's Code of Ethics, I have informed the patient of my clinical impression; the nature and purpose of the treatment or procedure; the risks, benefits, and possible complications of the intervention; the alternatives, including doing nothing; the risk(s) and benefit(s) of the alternative treatment(s) or procedure(s); and the risk(s) and benefit(s) of doing nothing. The patient was provided information about the general risks and possible complications associated with the procedure. These may include, but are not limited to: failure to achieve desired goals,  infection, bleeding, organ or nerve damage, allergic reactions, paralysis, and death. In addition, the patient was informed of those risks and complications associated to Spine-related procedures, such  as failure to decrease pain; infection (i.e.: Meningitis, epidural or intraspinal abscess); bleeding (i.e.: epidural hematoma, subarachnoid hemorrhage, or any other type of intraspinal or peri-dural bleeding); organ or nerve damage (i.e.: Any type of peripheral nerve, nerve root, or spinal cord injury) with subsequent damage to sensory, motor, and/or autonomic systems, resulting in permanent pain, numbness, and/or weakness of one or several areas of the body; allergic reactions; (i.e.: anaphylactic reaction); and/or death. Furthermore, the patient was informed of those risks and complications associated with the medications. These include, but are not limited to: allergic reactions (i.e.: anaphylactic or anaphylactoid reaction(s)); adrenal axis suppression; blood sugar elevation that in diabetics may result in ketoacidosis or comma; water retention that in patients with history of congestive heart failure may result in shortness of breath, pulmonary edema, and decompensation with resultant heart failure; weight gain; swelling or edema; medication-induced neural toxicity; particulate matter embolism and blood vessel occlusion with resultant organ, and/or nervous system infarction; and/or aseptic necrosis of one or more joints. Finally, the patient was informed that Medicine is not an exact science; therefore, there is also the possibility of unforeseen or unpredictable risks and/or possible complications that may result in a catastrophic outcome. The patient indicated having understood very clearly. We have given the patient no guarantees and we have made no promises. Enough time was given to the patient to ask questions, all of which were answered to the patient's satisfaction. Evelyn Stark has indicated that she  wanted to continue with the procedure. Attestation: I, the ordering provider, attest that I have discussed with the patient the benefits, risks, side-effects, alternatives, likelihood of achieving goals, and potential problems during recovery for the procedure that I have provided informed consent. Date  Time: 04/08/2019  8:00 AM  Pre-Procedure Preparation:  Monitoring: As per clinic protocol. Respiration, ETCO2, SpO2, BP, heart rate and rhythm monitor placed and checked for adequate function Safety Precautions: Patient was assessed for positional comfort and pressure points before starting the procedure. Time-out: I initiated and conducted the "Time-out" before starting the procedure, as per protocol. The patient was asked to participate by confirming the accuracy of the "Time Out" information. Verification of the correct person, site, and procedure were performed and confirmed by me, the nursing staff, and the patient. "Time-out" conducted as per Joint Commission's Universal Protocol (UP.01.01.01). Time: 7672  Description of Procedure:          Target Area: The interlaminar space, initially targeting the lower laminar border of the superior vertebral body. Approach: Paramedial approach. Area Prepped: Entire Posterior Lumbar Region Prepping solution: DuraPrep (Iodine Povacrylex [0.7% available iodine] and Isopropyl Alcohol, 74% w/w) Safety Precautions: Aspiration looking for blood return was conducted prior to all injections. At no point did we inject any substances, as a needle was being advanced. No attempts were made at seeking any paresthesias. Safe injection practices and needle disposal techniques used. Medications properly checked for expiration dates. SDV (single dose vial) medications used. Description of the Procedure: Protocol guidelines were followed. The procedure needle was introduced through the skin, ipsilateral to the reported pain, and advanced to the target area. Bone was contacted  and the needle walked caudad, until the lamina was cleared. The epidural space was identified using "loss-of-resistance technique" with 2-3 ml of PF-NaCl (0.9% NSS), in a 5cc LOR glass syringe.  Vitals:   04/08/19 0808 04/08/19 0837 04/08/19 0841 04/08/19 0845  BP: (!) 159/53 (!) 178/66 (!) 167/66 (!) 157/70  Pulse: 75     Resp: 18 20 20  18  SpO2: 94% 95% 97% 100%  Weight:      Height:        Start Time: 0835 hrs. End Time: 0844 hrs.  Materials:  Needle(s) Type: Epidural needle Gauge: 22G Length: 3.5-in Medication(s): Please see orders for medications and dosing details. 6 cc solution made of 3 cc of preservative-free saline, 2 cc of 0.2% ropivacaine, 1 cc of Decadron 10 mg/cc.  Imaging Guidance (Spinal):          Type of Imaging Technique: Fluoroscopy Guidance (Spinal) Indication(s): Assistance in needle guidance and placement for procedures requiring needle placement in or near specific anatomical locations not easily accessible without such assistance. Exposure Time: Please see nurses notes. Contrast: Before injecting any contrast, we confirmed that the patient did not have an allergy to iodine, shellfish, or radiological contrast. Once satisfactory needle placement was completed at the desired level, radiological contrast was injected. Contrast injected under live fluoroscopy. No contrast complications. See chart for type and volume of contrast used. Fluoroscopic Guidance: I was personally present during the use of fluoroscopy. "Tunnel Vision Technique" used to obtain the best possible view of the target area. Parallax error corrected before commencing the procedure. "Direction-depth-direction" technique used to introduce the needle under continuous pulsed fluoroscopy. Once target was reached, antero-posterior, oblique, and lateral fluoroscopic projection used confirm needle placement in all planes. Images permanently stored in EMR. Interpretation: I personally interpreted the imaging  intraoperatively. Adequate needle placement confirmed in multiple planes. Appropriate spread of contrast into desired area was observed. No evidence of afferent or efferent intravascular uptake. No intrathecal or subarachnoid spread observed. Permanent images saved into the patient's record.  Antibiotic Prophylaxis:   Anti-infectives (From admission, onward)   None     Indication(s): None identified  Post-operative Assessment:  Post-procedure Vital Signs:  Pulse/HCG Rate: 7578 Temp:   Resp: 18 BP: (!) 157/70 SpO2: 100 %  EBL: None  Complications: No immediate post-treatment complications observed by team, or reported by patient.  Note: The patient tolerated the entire procedure well. A repeat set of vitals were taken after the procedure and the patient was kept under observation following institutional policy, for this type of procedure. Post-procedural neurological assessment was performed, showing return to baseline, prior to discharge. The patient was provided with post-procedure discharge instructions, including a section on how to identify potential problems. Should any problems arise concerning this procedure, the patient was given instructions to immediately contact us, at any time, without hesitation. In any case, we plan to contact the patient by telephone for a follow-up status report regarding this interventional procedure.  Comments:  No additional relevant information.  Plan of Care  Orders:  Orders Placed This Encounter  Procedures  . DG PAIN CLINIC C-ARM 1-60 MIN NO REPORT    Intraoperative interpretation by procedural physician at [facility].    Standing Status:   Standing    Number of Occurrences:   1    Order Specific Question:   Reason for exam:    Answer:   Assistance in needle guidance and placement for procedures requiring needle placement in or near specific anatomical locations not easily accessible without such assistance.   Medications  ordered for procedure: Meds ordered this encounter  Medications  . iohexol (OMNIPAQUE) 180 MG/ML injection 10 mL    Must be Myelogram-compatible. If not available, you may substitute with a water-soluble, non-ionic, hypoallergenic, myelogram-compatible radiological contrast medium.  Marland Kitchen lidocaine (XYLOCAINE) 2 % (with pres) injection 400 mg  . ropivacaine (  PF) 2 mg/mL (0.2%) (NAROPIN) injection 2 mL  . sodium chloride flush (NS) 0.9 % injection 2 mL  . dexamethasone (DECADRON) injection 10 mg   Medications administered: We administered iohexol, lidocaine, ropivacaine (PF) 2 mg/mL (0.2%), sodium chloride flush, and dexamethasone.  See the medical record for exact dosing, route, and time of administration.  Follow-up plan:   Return if symptoms worsen or fail to improve.     Recent Visits Date Type Provider Dept  04/02/19 Office Visit Gillis Santa, MD [facility]  Showing recent visits within past 90 days and meeting all other requirements   Today's Visits Date Type Provider Dept  04/08/19 Procedure visit Gillis Santa, MD [facility]  Showing today's visits and meeting all other requirements   Future Appointments No visits were found meeting these conditions.  Showing future appointments within next 90 days and meeting all other requirements   Disposition: Discharge home  Discharge (Date  Time): 04/08/2019; 0855 hrs.   Primary Care Physician: Ronnell Freshwater, NP Location: [facility] Note by: Gillis Santa, MD Date: 04/08/2019; Time: 8:55 AM  Disclaimer:  Medicine is not an exact science. The only guarantee in medicine is that nothing is guaranteed. It is important to note that the decision to proceed with this intervention was based on the information collected from the patient. The Data and conclusions were drawn from the patient's questionnaire, the interview, and the physical examination. Because the information was provided in  large part by the patient, it cannot be guaranteed that it has not been purposely or unconsciously manipulated. Every effort has been made to obtain as much relevant data as possible for this evaluation. It is important to note that the conclusions that lead to this procedure are derived in large part from the available data. Always take into account that the treatment will also be dependent on availability of resources and existing treatment guidelines, considered by other Pain Management Practitioners as being common knowledge and practice, at the time of the intervention. For Medico-Legal purposes, it is also important to point out that variation in procedural techniques and pharmacological choices are the acceptable norm. The indications, contraindications, technique, and results of the above procedure should only be interpreted and judged by a Board-Certified Interventional Pain Specialist with extensive familiarity and expertise in the same exact procedure and technique.

## 2019-04-09 ENCOUNTER — Telehealth: Payer: Self-pay

## 2019-04-09 NOTE — Telephone Encounter (Signed)
Daughter answered phone. States she is feeling better. Instructed to call if needed.

## 2019-04-15 ENCOUNTER — Other Ambulatory Visit: Payer: Self-pay

## 2019-04-15 MED ORDER — ISOSORBIDE MONONITRATE ER 30 MG PO TB24: 30.0000 mg | ORAL_TABLET | Freq: Every day | ORAL | 1 refills | Status: DC

## 2019-04-24 ENCOUNTER — Other Ambulatory Visit: Payer: Self-pay | Admitting: Nurse Practitioner

## 2019-04-24 DIAGNOSIS — G250 Essential tremor: Secondary | ICD-10-CM

## 2019-04-24 MED ORDER — PRIMIDONE 50 MG PO TABS: 50.0000 mg | ORAL_TABLET | Freq: Three times a day (TID) | ORAL | 3 refills | Status: DC

## 2019-04-24 NOTE — Progress Notes (Signed)
Primidone 50mg  [facility]ID [facility] due to development of essential tremor.

## 2019-05-07 ENCOUNTER — Other Ambulatory Visit: Payer: Self-pay

## 2019-05-07 DIAGNOSIS — E1165 Type 2 diabetes mellitus with hyperglycemia: Secondary | ICD-10-CM

## 2019-05-07 MED ORDER — ESCITALOPRAM OXALATE 20 MG PO TABS: ORAL_TABLET | ORAL | 3 refills | Status: DC

## 2019-05-07 MED ORDER — QUETIAPINE FUMARATE 50 MG PO TABS: 50.0000 mg | ORAL_TABLET | Freq: Every day | ORAL | 3 refills | Status: DC

## 2019-05-07 MED ORDER — BASAGLAR KWIKPEN 100 UNIT/ML ~~LOC~~ SOPN: PEN_INJECTOR | SUBCUTANEOUS | 3 refills | Status: DC

## 2019-05-07 NOTE — Telephone Encounter (Signed)
CALLED IN PHAR REFILLS LEXAPRO,BASAGLAR AND SEROQUEL TO [facility]

## 2019-05-09 ENCOUNTER — Other Ambulatory Visit: Payer: Self-pay

## 2019-05-09 DIAGNOSIS — G250 Essential tremor: Secondary | ICD-10-CM

## 2019-05-09 DIAGNOSIS — I10 Essential (primary) hypertension: Secondary | ICD-10-CM

## 2019-05-09 MED ORDER — ASPIRIN EC 81 MG PO TBEC: 81.0000 mg | DELAYED_RELEASE_TABLET | Freq: Every day | ORAL | 3 refills | Status: DC

## 2019-05-09 MED ORDER — CANAGLIFLOZIN 300 MG PO TABS: 300.0000 mg | ORAL_TABLET | Freq: Every day | ORAL | 3 refills | Status: DC

## 2019-05-09 MED ORDER — BD PEN NEEDLE NANO U/F 32G X 4 MM MISC: 5 refills | Status: DC

## 2019-05-09 MED ORDER — ATORVASTATIN CALCIUM 40 MG PO TABS: 20.0000 mg | ORAL_TABLET | Freq: Every day | ORAL | 3 refills | Status: DC

## 2019-05-09 MED ORDER — GABAPENTIN 300 MG PO CAPS: 300.0000 mg | ORAL_CAPSULE | Freq: Two times a day (BID) | ORAL | 1 refills | Status: DC

## 2019-05-09 MED ORDER — AMLODIPINE BESYLATE 10 MG PO TABS: 10.0000 mg | ORAL_TABLET | Freq: Two times a day (BID) | ORAL | 3 refills | Status: DC

## 2019-05-09 MED ORDER — ESOMEPRAZOLE MAGNESIUM 40 MG PO CPDR: 40.0000 mg | DELAYED_RELEASE_CAPSULE | Freq: Every day | ORAL | 3 refills | Status: DC

## 2019-05-09 MED ORDER — LEVOTHYROXINE SODIUM 137 MCG PO TABS: 137.0000 ug | ORAL_TABLET | Freq: Every day | ORAL | 3 refills | Status: DC

## 2019-05-09 MED ORDER — FUROSEMIDE 20 MG PO TABS: ORAL_TABLET | ORAL | 1 refills | Status: DC

## 2019-05-09 MED ORDER — TRULANCE 3 MG PO TABS: 3.0000 mg | ORAL_TABLET | Freq: Every day | ORAL | 3 refills | Status: DC

## 2019-05-09 MED ORDER — ZOLPIDEM TARTRATE 10 MG PO TABS: 10.0000 mg | ORAL_TABLET | Freq: Every evening | ORAL | 1 refills | Status: DC | PRN

## 2019-05-09 MED ORDER — TRAMADOL HCL 50 MG PO TABS: 50.0000 mg | ORAL_TABLET | Freq: Four times a day (QID) | ORAL | 1 refills | Status: DC

## 2019-05-09 MED ORDER — MOMETASONE FURO-FORMOTEROL FUM 100-5 MCG/ACT IN AERO: 2.0000 | INHALATION_SPRAY | Freq: Two times a day (BID) | RESPIRATORY_TRACT | 1 refills | Status: DC

## 2019-05-09 MED ORDER — HYDRALAZINE HCL 100 MG PO TABS: 100.0000 mg | ORAL_TABLET | Freq: Four times a day (QID) | ORAL | 3 refills | Status: DC

## 2019-05-09 MED ORDER — BUDESONIDE-FORMOTEROL FUMARATE 160-4.5 MCG/ACT IN AERO: 2.0000 | INHALATION_SPRAY | Freq: Two times a day (BID) | RESPIRATORY_TRACT | 3 refills | Status: DC | PRN

## 2019-05-09 MED ORDER — GLIMEPIRIDE 2 MG PO TABS: 2.0000 mg | ORAL_TABLET | Freq: Two times a day (BID) | ORAL | 1 refills | Status: DC

## 2019-05-09 MED ORDER — SAXAGLIPTIN HCL 5 MG PO TABS: 5.0000 mg | ORAL_TABLET | Freq: Every day | ORAL | 2 refills | Status: DC

## 2019-05-09 MED ORDER — CONTOUR NEXT TEST VI STRP: ORAL_STRIP | 3 refills | Status: AC

## 2019-05-09 MED ORDER — MICROLET LANCETS MISC: 3 refills | Status: DC

## 2019-05-09 MED ORDER — BYSTOLIC 20 MG PO TABS: 20.0000 mg | ORAL_TABLET | Freq: Every day | ORAL | 3 refills | Status: DC

## 2019-05-09 MED ORDER — ALPRAZOLAM 0.25 MG PO TABS: 0.2500 mg | ORAL_TABLET | Freq: Two times a day (BID) | ORAL | 1 refills | Status: DC | PRN

## 2019-05-09 MED ORDER — ISOSORBIDE MONONITRATE ER 30 MG PO TB24: 30.0000 mg | ORAL_TABLET | Freq: Every day | ORAL | 1 refills | Status: DC

## 2019-05-09 MED ORDER — ALENDRONATE SODIUM 70 MG PO TABS: 70.0000 mg | ORAL_TABLET | ORAL | 3 refills | Status: AC

## 2019-05-13 ENCOUNTER — Other Ambulatory Visit: Payer: Self-pay

## 2019-05-13 MED ORDER — MICROLET LANCETS MISC: 3 refills | Status: DC

## 2019-05-13 MED ORDER — CONTOUR TEST VI STRP: 1.0000 | ORAL_STRIP | Freq: Three times a day (TID) | 3 refills | Status: DC

## 2019-05-16 ENCOUNTER — Other Ambulatory Visit: Payer: Medicare Other

## 2019-05-20 ENCOUNTER — Other Ambulatory Visit: Payer: Medicare Other

## 2019-05-21 ENCOUNTER — Other Ambulatory Visit: Payer: Self-pay

## 2019-05-21 ENCOUNTER — Ambulatory Visit: Payer: Medicare Other | Attending: Internal Medicine

## 2019-05-21 DIAGNOSIS — Z20822 Contact with and (suspected) exposure to covid-19: Secondary | ICD-10-CM

## 2019-05-22 LAB — NOVEL CORONAVIRUS, NAA: SARS-CoV-2, NAA: NOT DETECTED

## 2019-07-11 ENCOUNTER — Telehealth: Payer: Self-pay

## 2019-07-11 NOTE — Telephone Encounter (Signed)
CMN for oxygen signed and placed in [facility] folder.

## 2019-07-13 ENCOUNTER — Other Ambulatory Visit: Payer: Self-pay | Admitting: Internal Medicine

## 2019-07-13 MED ORDER — TRULANCE 3 MG PO TABS: ORAL_TABLET | ORAL | 3 refills | Status: AC

## 2019-07-31 ENCOUNTER — Other Ambulatory Visit: Payer: Self-pay

## 2019-07-31 MED ORDER — PRIMIDONE 250 MG PO TABS: 250.0000 mg | ORAL_TABLET | Freq: Four times a day (QID) | ORAL | 1 refills | Status: DC

## 2019-10-11 ENCOUNTER — Telehealth: Payer: Self-pay

## 2019-10-11 NOTE — Telephone Encounter (Signed)
Authorization approved for Trulance tablet from 07/13/2019 through 10/10/2020 [facility]

## 2019-12-10 ENCOUNTER — Other Ambulatory Visit: Payer: Self-pay

## 2019-12-10 MED ORDER — MOMETASONE FURO-FORMOTEROL FUM 100-5 MCG/ACT IN AERO: 2.0000 | INHALATION_SPRAY | Freq: Two times a day (BID) | RESPIRATORY_TRACT | 3 refills | Status: DC

## 2020-02-08 ENCOUNTER — Other Ambulatory Visit: Payer: Self-pay

## 2020-02-08 ENCOUNTER — Other Ambulatory Visit: Payer: Self-pay | Admitting: Internal Medicine

## 2020-02-08 DIAGNOSIS — I10 Essential (primary) hypertension: Secondary | ICD-10-CM

## 2020-02-08 DIAGNOSIS — E1165 Type 2 diabetes mellitus with hyperglycemia: Secondary | ICD-10-CM

## 2020-02-08 MED ORDER — LEVOTHYROXINE SODIUM 137 MCG PO TABS: 137.0000 ug | ORAL_TABLET | Freq: Every day | ORAL | 3 refills | Status: DC

## 2020-02-08 MED ORDER — HYDRALAZINE HCL 100 MG PO TABS
100.0000 mg | ORAL_TABLET | Freq: Four times a day (QID) | ORAL | 3 refills | Status: DC
Start: 2020-02-08 — End: 2020-08-04

## 2020-02-08 MED ORDER — QUETIAPINE FUMARATE 50 MG PO TABS: 50.0000 mg | ORAL_TABLET | Freq: Every day | ORAL | 3 refills | Status: DC

## 2020-02-08 MED ORDER — TRAMADOL HCL 50 MG PO TABS: 50.0000 mg | ORAL_TABLET | Freq: Four times a day (QID) | ORAL | 1 refills | Status: DC

## 2020-02-08 MED ORDER — ESCITALOPRAM OXALATE 20 MG PO TABS
ORAL_TABLET | ORAL | 3 refills | Status: DC
Start: 2020-02-08 — End: 2020-08-04

## 2020-02-08 MED ORDER — BASAGLAR KWIKPEN 100 UNIT/ML ~~LOC~~ SOPN: PEN_INJECTOR | SUBCUTANEOUS | 3 refills | Status: DC

## 2020-02-08 MED ORDER — ISOSORBIDE MONONITRATE ER 60 MG PO TB24: ORAL_TABLET | ORAL | 3 refills | Status: DC

## 2020-02-08 MED ORDER — ALPRAZOLAM 0.25 MG PO TABS
0.2500 mg | ORAL_TABLET | Freq: Two times a day (BID) | ORAL | 1 refills | Status: DC | PRN
Start: 2020-02-08 — End: 2020-08-05

## 2020-02-08 MED ORDER — NEBIVOLOL HCL 20 MG PO TABS: 20.0000 mg | ORAL_TABLET | Freq: Every day | ORAL | 3 refills | Status: AC

## 2020-02-08 MED ORDER — SAXAGLIPTIN HCL 5 MG PO TABS: 5.0000 mg | ORAL_TABLET | Freq: Every day | ORAL | 2 refills | Status: DC

## 2020-02-08 MED ORDER — ATORVASTATIN CALCIUM 40 MG PO TABS: 20.0000 mg | ORAL_TABLET | Freq: Every day | ORAL | 3 refills | Status: DC

## 2020-02-08 MED ORDER — GABAPENTIN 300 MG PO CAPS
300.0000 mg | ORAL_CAPSULE | Freq: Two times a day (BID) | ORAL | 1 refills | Status: DC
Start: 2020-02-08 — End: 2020-08-04

## 2020-02-08 MED ORDER — AMLODIPINE BESYLATE 10 MG PO TABS: 10.0000 mg | ORAL_TABLET | Freq: Two times a day (BID) | ORAL | 3 refills | Status: DC

## 2020-02-08 MED ORDER — MOMETASONE FURO-FORMOTEROL FUM 100-5 MCG/ACT IN AERO: 2.0000 | INHALATION_SPRAY | Freq: Two times a day (BID) | RESPIRATORY_TRACT | 3 refills | Status: DC

## 2020-02-08 MED ORDER — CANAGLIFLOZIN 300 MG PO TABS
300.0000 mg | ORAL_TABLET | Freq: Every day | ORAL | 3 refills | Status: DC
Start: 2020-02-08 — End: 2020-03-03

## 2020-02-08 MED ORDER — ESOMEPRAZOLE MAGNESIUM 40 MG PO CPDR: 40.0000 mg | DELAYED_RELEASE_CAPSULE | Freq: Every day | ORAL | 3 refills | Status: DC

## 2020-02-08 NOTE — Telephone Encounter (Signed)
PA for Nexium 40 mg approved on 02/08/20 coverage from 11/10/19 to 02/07/2021.  New rx sent to pharmacy

## 2020-03-03 ENCOUNTER — Other Ambulatory Visit: Payer: Self-pay

## 2020-03-03 MED ORDER — SITAGLIPTIN PHOSPHATE 100 MG PO TABS
ORAL_TABLET | ORAL | 1 refills | Status: DC
Start: 2020-03-03 — End: 2020-08-04

## 2020-03-03 MED ORDER — DAPAGLIFLOZIN PROPANEDIOL 10 MG PO TABS
ORAL_TABLET | ORAL | 1 refills | Status: DC
Start: 2020-03-03 — End: 2020-08-04

## 2020-03-09 ENCOUNTER — Other Ambulatory Visit: Payer: Self-pay

## 2020-03-09 MED ORDER — TRELEGY ELLIPTA 100-62.5-25 MCG/INH IN AEPB: INHALATION_SPRAY | RESPIRATORY_TRACT | 3 refills | Status: AC

## 2020-03-13 ENCOUNTER — Other Ambulatory Visit: Payer: Self-pay

## 2020-03-13 MED ORDER — GLIMEPIRIDE 2 MG PO TABS: 2.0000 mg | ORAL_TABLET | Freq: Two times a day (BID) | ORAL | 1 refills | Status: DC

## 2020-04-18 ENCOUNTER — Other Ambulatory Visit: Payer: Self-pay | Admitting: Hospice and Palliative Medicine

## 2020-04-18 MED ORDER — CELECOXIB 100 MG PO CAPS: 100.0000 mg | ORAL_CAPSULE | Freq: Two times a day (BID) | ORAL | 0 refills | Status: DC

## 2020-06-03 ENCOUNTER — Other Ambulatory Visit: Payer: Self-pay

## 2020-06-03 MED ORDER — CONTOUR TEST VI STRP: 1.0000 | ORAL_STRIP | Freq: Three times a day (TID) | 3 refills | Status: AC

## 2020-06-03 MED ORDER — MICROLET LANCETS MISC: 3 refills | Status: DC

## 2020-08-04 ENCOUNTER — Other Ambulatory Visit: Payer: Self-pay

## 2020-08-04 DIAGNOSIS — E1165 Type 2 diabetes mellitus with hyperglycemia: Secondary | ICD-10-CM

## 2020-08-04 MED ORDER — AMLODIPINE BESYLATE 10 MG PO TABS: 10.0000 mg | ORAL_TABLET | Freq: Two times a day (BID) | ORAL | 3 refills | Status: AC

## 2020-08-04 MED ORDER — MICROLET LANCETS MISC: 3 refills | Status: AC

## 2020-08-04 MED ORDER — ALENDRONATE SODIUM 70 MG PO TABS: 70.0000 mg | ORAL_TABLET | ORAL | 11 refills | Status: AC

## 2020-08-04 MED ORDER — BASAGLAR KWIKPEN 100 UNIT/ML ~~LOC~~ SOPN: PEN_INJECTOR | SUBCUTANEOUS | 3 refills | Status: DC

## 2020-08-04 MED ORDER — FUROSEMIDE 20 MG PO TABS: ORAL_TABLET | ORAL | 3 refills | Status: AC

## 2020-08-04 MED ORDER — ISOSORBIDE MONONITRATE ER 60 MG PO TB24: ORAL_TABLET | ORAL | 3 refills | Status: AC

## 2020-08-04 MED ORDER — BD PEN NEEDLE NANO U/F 32G X 4 MM MISC: 5 refills | Status: DC

## 2020-08-04 MED ORDER — METFORMIN HCL 500 MG PO TABS: ORAL_TABLET | ORAL | 3 refills | Status: AC

## 2020-08-04 MED ORDER — ASPIRIN EC 81 MG PO TBEC: 81.0000 mg | DELAYED_RELEASE_TABLET | Freq: Every day | ORAL | 3 refills | Status: AC

## 2020-08-04 MED ORDER — LEVOTHYROXINE SODIUM 137 MCG PO TABS: 137.0000 ug | ORAL_TABLET | Freq: Every day | ORAL | 3 refills | Status: AC

## 2020-08-04 MED ORDER — QUETIAPINE FUMARATE 100 MG PO TABS: 100.0000 mg | ORAL_TABLET | Freq: Every day | ORAL | 3 refills | Status: AC

## 2020-08-04 MED ORDER — HYDRALAZINE HCL 100 MG PO TABS: 100.0000 mg | ORAL_TABLET | Freq: Four times a day (QID) | ORAL | 3 refills | Status: AC

## 2020-08-04 MED ORDER — SITAGLIPTIN PHOSPHATE 100 MG PO TABS: ORAL_TABLET | ORAL | 3 refills | Status: AC

## 2020-08-04 MED ORDER — GABAPENTIN 300 MG PO CAPS: 300.0000 mg | ORAL_CAPSULE | Freq: Three times a day (TID) | ORAL | 3 refills | Status: AC

## 2020-08-04 MED ORDER — GLIMEPIRIDE 2 MG PO TABS: 2.0000 mg | ORAL_TABLET | Freq: Two times a day (BID) | ORAL | 3 refills | Status: AC

## 2020-08-04 MED ORDER — CELECOXIB 100 MG PO CAPS: 100.0000 mg | ORAL_CAPSULE | Freq: Two times a day (BID) | ORAL | 0 refills | Status: DC

## 2020-08-04 MED ORDER — DAPAGLIFLOZIN PROPANEDIOL 10 MG PO TABS: ORAL_TABLET | ORAL | 1 refills | Status: AC

## 2020-08-04 MED ORDER — QUETIAPINE FUMARATE 50 MG PO TABS: 50.0000 mg | ORAL_TABLET | Freq: Every day | ORAL | 3 refills | Status: AC

## 2020-08-04 MED ORDER — ATORVASTATIN CALCIUM 40 MG PO TABS: 20.0000 mg | ORAL_TABLET | Freq: Every day | ORAL | 3 refills | Status: AC

## 2020-08-04 MED ORDER — ESOMEPRAZOLE MAGNESIUM 40 MG PO CPDR: 40.0000 mg | DELAYED_RELEASE_CAPSULE | Freq: Every day | ORAL | 3 refills | Status: AC

## 2020-08-04 MED ORDER — ESCITALOPRAM OXALATE 20 MG PO TABS: ORAL_TABLET | ORAL | 3 refills | Status: AC

## 2020-08-05 ENCOUNTER — Other Ambulatory Visit: Payer: Self-pay | Admitting: Internal Medicine

## 2020-08-05 MED ORDER — TRAMADOL HCL 50 MG PO TABS: 50.0000 mg | ORAL_TABLET | Freq: Four times a day (QID) | ORAL | 1 refills | Status: AC

## 2020-08-05 MED ORDER — ALPRAZOLAM 0.25 MG PO TABS: 0.2500 mg | ORAL_TABLET | Freq: Two times a day (BID) | ORAL | 1 refills | Status: AC | PRN

## 2020-08-05 MED ORDER — ZOLPIDEM TARTRATE 10 MG PO TABS: 10.0000 mg | ORAL_TABLET | Freq: Every evening | ORAL | 1 refills | Status: AC | PRN

## 2020-08-05 MED ORDER — TRAMADOL HCL 50 MG PO TABS: 50.0000 mg | ORAL_TABLET | Freq: Four times a day (QID) | ORAL | 1 refills | Status: DC

## 2020-08-05 MED ORDER — ZOLPIDEM TARTRATE 10 MG PO TABS: 10.0000 mg | ORAL_TABLET | Freq: Every evening | ORAL | 1 refills | Status: DC | PRN

## 2020-08-05 MED ORDER — ALPRAZOLAM 0.25 MG PO TABS: 0.2500 mg | ORAL_TABLET | Freq: Two times a day (BID) | ORAL | 1 refills | Status: DC | PRN

## 2020-08-06 ENCOUNTER — Other Ambulatory Visit: Payer: Self-pay

## 2020-08-06 MED ORDER — PANTOPRAZOLE SODIUM 40 MG PO TBEC: 40.0000 mg | DELAYED_RELEASE_TABLET | Freq: Every day | ORAL | 3 refills | Status: AC

## 2020-08-19 ENCOUNTER — Other Ambulatory Visit: Payer: Self-pay

## 2020-08-19 MED ORDER — RYBELSUS 7 MG PO TABS: 7.0000 mg | ORAL_TABLET | Freq: Every day | ORAL | 3 refills | Status: AC

## 2020-10-05 ENCOUNTER — Ambulatory Visit: Payer: Medicare Other

## 2020-10-05 ENCOUNTER — Other Ambulatory Visit: Payer: Self-pay

## 2020-10-07 ENCOUNTER — Ambulatory Visit: Payer: Medicare Other

## 2020-10-08 ENCOUNTER — Ambulatory Visit: Payer: Medicare Other | Admitting: Internal Medicine

## 2020-10-08 ENCOUNTER — Other Ambulatory Visit: Payer: Self-pay

## 2020-10-08 DIAGNOSIS — Z0000 Encounter for general adult medical examination without abnormal findings: Secondary | ICD-10-CM

## 2020-10-08 NOTE — Progress Notes (Signed)
Note done in error.

## 2020-10-09 ENCOUNTER — Other Ambulatory Visit: Payer: Self-pay

## 2020-10-09 MED ORDER — ALBUTEROL SULFATE HFA 108 (90 BASE) MCG/ACT IN AERS: INHALATION_SPRAY | RESPIRATORY_TRACT | 3 refills | Status: AC

## 2020-10-09 MED ORDER — EPINEPHRINE 0.3 MG/0.3ML IJ SOAJ: 0.3000 mg | INTRAMUSCULAR | 3 refills | Status: AC | PRN

## 2020-11-03 ENCOUNTER — Other Ambulatory Visit: Payer: Self-pay | Admitting: Nurse Practitioner

## 2020-11-04 ENCOUNTER — Other Ambulatory Visit: Payer: Self-pay

## 2020-11-04 MED ORDER — SUMATRIPTAN SUCCINATE 6 MG/0.5ML ~~LOC~~ SOLN: SUBCUTANEOUS | 3 refills | Status: AC

## 2020-11-10 ENCOUNTER — Other Ambulatory Visit: Payer: Self-pay

## 2020-11-10 DIAGNOSIS — E1165 Type 2 diabetes mellitus with hyperglycemia: Secondary | ICD-10-CM

## 2020-11-10 MED ORDER — BASAGLAR KWIKPEN 100 UNIT/ML ~~LOC~~ SOPN: PEN_INJECTOR | SUBCUTANEOUS | 3 refills | Status: AC

## 2020-11-10 MED ORDER — BD PEN NEEDLE NANO U/F 32G X 4 MM MISC: 5 refills | Status: AC

## 2020-11-10 MED ORDER — LINACLOTIDE 72 MCG PO CAPS: 72.0000 ug | ORAL_CAPSULE | Freq: Every day | ORAL | 3 refills | Status: AC

## 2020-12-05 DEATH — deceased
